# Patient Record
Sex: Female | Born: 1955 | Race: White | Hispanic: No | Marital: Married | Smoking: Never smoker
Health system: Southern US, Community
[De-identification: ages and names within clinical notes are randomized; demographics above are authoritative.]

## PROBLEM LIST (undated history)

## (undated) DIAGNOSIS — G500 Trigeminal neuralgia: Secondary | ICD-10-CM

## (undated) DIAGNOSIS — F419 Anxiety disorder, unspecified: Secondary | ICD-10-CM

## (undated) DIAGNOSIS — T8859XA Other complications of anesthesia, initial encounter: Secondary | ICD-10-CM

## (undated) DIAGNOSIS — Z87442 Personal history of urinary calculi: Secondary | ICD-10-CM

## (undated) DIAGNOSIS — S0300XA Dislocation of jaw, unspecified side, initial encounter: Secondary | ICD-10-CM

## (undated) DIAGNOSIS — K219 Gastro-esophageal reflux disease without esophagitis: Secondary | ICD-10-CM

## (undated) DIAGNOSIS — M1990 Unspecified osteoarthritis, unspecified site: Secondary | ICD-10-CM

## (undated) DIAGNOSIS — E039 Hypothyroidism, unspecified: Secondary | ICD-10-CM

## (undated) HISTORY — PX: CHOLECYSTECTOMY: SHX55

## (undated) HISTORY — PX: OTHER SURGICAL HISTORY: SHX169

## (undated) HISTORY — DX: Dislocation of jaw, unspecified side, initial encounter: S03.00XA

## (undated) HISTORY — DX: Anxiety disorder, unspecified: F41.9

## (undated) HISTORY — DX: Trigeminal neuralgia: G50.0

---

## 1997-11-17 ENCOUNTER — Other Ambulatory Visit: Admission: RE | Admit: 1997-11-17 | Discharge: 1997-11-17 | Payer: Self-pay | Admitting: Obstetrics and Gynecology

## 1998-11-22 ENCOUNTER — Other Ambulatory Visit: Admission: RE | Admit: 1998-11-22 | Discharge: 1998-11-22 | Payer: Self-pay | Admitting: Obstetrics and Gynecology

## 1999-04-26 ENCOUNTER — Encounter (INDEPENDENT_AMBULATORY_CARE_PROVIDER_SITE_OTHER): Payer: Self-pay

## 1999-04-26 ENCOUNTER — Observation Stay (HOSPITAL_COMMUNITY): Admission: RE | Admit: 1999-04-26 | Discharge: 1999-04-27 | Payer: Self-pay | Admitting: *Deleted

## 2000-01-09 ENCOUNTER — Other Ambulatory Visit: Admission: RE | Admit: 2000-01-09 | Discharge: 2000-01-09 | Payer: Self-pay | Admitting: Obstetrics and Gynecology

## 2000-10-10 ENCOUNTER — Ambulatory Visit (HOSPITAL_COMMUNITY): Admission: RE | Admit: 2000-10-10 | Discharge: 2000-10-10 | Payer: Self-pay | Admitting: Obstetrics and Gynecology

## 2000-10-10 ENCOUNTER — Encounter (INDEPENDENT_AMBULATORY_CARE_PROVIDER_SITE_OTHER): Payer: Self-pay

## 2000-10-10 ENCOUNTER — Inpatient Hospital Stay (HOSPITAL_COMMUNITY): Admission: AD | Admit: 2000-10-10 | Discharge: 2000-10-10 | Payer: Self-pay | Admitting: Obstetrics and Gynecology

## 2001-01-29 ENCOUNTER — Other Ambulatory Visit: Admission: RE | Admit: 2001-01-29 | Discharge: 2001-01-29 | Payer: Self-pay | Admitting: Obstetrics and Gynecology

## 2001-02-06 ENCOUNTER — Encounter: Payer: Self-pay | Admitting: Obstetrics and Gynecology

## 2001-02-06 ENCOUNTER — Ambulatory Visit (HOSPITAL_COMMUNITY): Admission: RE | Admit: 2001-02-06 | Discharge: 2001-02-06 | Payer: Self-pay | Admitting: Obstetrics and Gynecology

## 2001-02-10 ENCOUNTER — Encounter: Payer: Self-pay | Admitting: Obstetrics and Gynecology

## 2001-02-10 ENCOUNTER — Encounter: Admission: RE | Admit: 2001-02-10 | Discharge: 2001-02-10 | Payer: Self-pay | Admitting: Obstetrics and Gynecology

## 2001-02-11 ENCOUNTER — Encounter: Payer: Self-pay | Admitting: Urology

## 2001-02-11 ENCOUNTER — Encounter: Admission: RE | Admit: 2001-02-11 | Discharge: 2001-02-11 | Payer: Self-pay | Admitting: Urology

## 2002-02-26 ENCOUNTER — Other Ambulatory Visit: Admission: RE | Admit: 2002-02-26 | Discharge: 2002-02-26 | Payer: Self-pay | Admitting: Obstetrics and Gynecology

## 2002-12-02 ENCOUNTER — Ambulatory Visit (HOSPITAL_COMMUNITY): Admission: RE | Admit: 2002-12-02 | Discharge: 2002-12-02 | Payer: Self-pay | Admitting: Obstetrics and Gynecology

## 2002-12-02 ENCOUNTER — Encounter: Payer: Self-pay | Admitting: Obstetrics and Gynecology

## 2003-05-18 ENCOUNTER — Other Ambulatory Visit: Admission: RE | Admit: 2003-05-18 | Discharge: 2003-05-18 | Payer: Self-pay | Admitting: Obstetrics and Gynecology

## 2003-05-19 ENCOUNTER — Ambulatory Visit (HOSPITAL_COMMUNITY): Admission: RE | Admit: 2003-05-19 | Discharge: 2003-05-19 | Payer: Self-pay | Admitting: Urology

## 2003-11-09 ENCOUNTER — Ambulatory Visit (HOSPITAL_BASED_OUTPATIENT_CLINIC_OR_DEPARTMENT_OTHER): Admission: RE | Admit: 2003-11-09 | Discharge: 2003-11-09 | Payer: Self-pay | Admitting: Urology

## 2004-03-18 HISTORY — PX: BACK SURGERY: SHX140

## 2004-06-08 ENCOUNTER — Ambulatory Visit (HOSPITAL_COMMUNITY): Admission: RE | Admit: 2004-06-08 | Discharge: 2004-06-08 | Payer: Self-pay | Admitting: Obstetrics and Gynecology

## 2004-06-08 ENCOUNTER — Encounter (INDEPENDENT_AMBULATORY_CARE_PROVIDER_SITE_OTHER): Payer: Self-pay | Admitting: *Deleted

## 2004-08-23 ENCOUNTER — Other Ambulatory Visit: Admission: RE | Admit: 2004-08-23 | Discharge: 2004-08-23 | Payer: Self-pay | Admitting: Obstetrics and Gynecology

## 2006-01-01 ENCOUNTER — Emergency Department: Payer: Self-pay | Admitting: Emergency Medicine

## 2006-03-24 ENCOUNTER — Ambulatory Visit: Payer: Self-pay | Admitting: Specialist

## 2006-04-15 ENCOUNTER — Ambulatory Visit (HOSPITAL_COMMUNITY): Admission: RE | Admit: 2006-04-15 | Discharge: 2006-04-16 | Payer: Self-pay | Admitting: Neurosurgery

## 2006-04-15 ENCOUNTER — Encounter (INDEPENDENT_AMBULATORY_CARE_PROVIDER_SITE_OTHER): Payer: Self-pay | Admitting: *Deleted

## 2007-12-03 ENCOUNTER — Encounter: Admission: RE | Admit: 2007-12-03 | Discharge: 2007-12-03 | Payer: Self-pay | Admitting: Obstetrics and Gynecology

## 2009-12-11 ENCOUNTER — Encounter: Admission: RE | Admit: 2009-12-11 | Discharge: 2009-12-11 | Payer: Self-pay | Admitting: Obstetrics and Gynecology

## 2010-04-08 ENCOUNTER — Encounter: Payer: Self-pay | Admitting: Obstetrics and Gynecology

## 2010-04-09 ENCOUNTER — Encounter: Payer: Self-pay | Admitting: Obstetrics and Gynecology

## 2010-08-03 NOTE — Op Note (Signed)
NAME:  Erin Fischer, Erin Fischer                           ACCOUNT NO.:  [id number]   MEDICAL RECORD NO.:  [id number]                   PATIENT TYPE:  AMB   LOCATION:  [facility]                                 FACILITY:  [facility]   PHYSICIAN:  Valetta Fuller, M.D.               DATE OF BIRTH:  October 07, 1955   DATE OF PROCEDURE:  DATE OF DISCHARGE:                                 OPERATIVE REPORT   PREOPERATIVE DIAGNOSES:  Left distal ureteral calculus.   POSTOPERATIVE DIAGNOSES:  Left distal ureteral calculus.   PROCEDURE:  Cystoscopy, retrograde pyelography, ureteroscopy, holmium laser  lithotripsy, basketing of fragments, double J stent placement.   SURGEON:  Valetta Fuller, M.D.   ANESTHESIA:  General.   INDICATIONS FOR PROCEDURE:  [name] is a 55 year old female. She originally  presented several months ago with a proximal left ureteral stone.  She  underwent lithotripsy which partially fragmented the stone. She continued to  have a 6-7 mm stone fragment in her proximal ureter. She remained relatively  asymptomatic. She had an assessment which suggested no significant  hydronephrosis. Because she was relatively asymptomatic, we elected to  continue to observe this.  Eventually the stone migrated distally and has  been more symptomatic at this point. Because this has been ongoing for  several months, we felt that intervention was now indicated.   TECHNIQUE:  The patient was brought to the operating room where she had  successful induction of general anesthesia.  She was placed in lithotomy  position and prepped and draped in the usual manner.  Cystoscopy was  unremarkable. Retrograde pyelogram confirmed the 6 mm filling defect in the  distal left ureter with some mild dilation and hydronephrosis.  A guidewire  was passed beyond the stone. Direct visual ureteroscopy was performed with a  short 6 French ureteroscope. The stone was easily encountered and the distal  ureter was engaged without the  need for dilation. The stone itself appeared  to be somewhat impacted in the distal ureter. A holmium laser lithotriptor  fiber was then used to break up the stone into innumerable pieces. The  largest 3-4 fragments were basket extracted. With the guidewire in good  position, we placed a 6 French 24 cm double J stent with a dangle string  which was attached to the patient's inner thigh. She appeared to tolerate  the procedure well and there were no obvious complications.                                               Valetta Fuller, M.D.    DSG/MEDQ  D:  11/09/2003  T:  11/09/2003  Job:  [id number]

## 2010-08-03 NOTE — Op Note (Signed)
NAMEKATORI, WIRSING                 ACCOUNT NO.:  [id number]   MEDICAL RECORD NO.:  [id number]          PATIENT TYPE:  AMB   LOCATION:  [facility]S                          FACILITY:  [facility]   PHYSICIAN:  Clydene Fake, M.D.  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   PROCEDURE:  Left L4-5 laminectomy for resection of epidural mass,  microdissection with microscope.   SURGEON:  Clydene Fake, M.D.   ASSISTANT:  None.   General endotracheal tube anesthesia.   SPECIMEN:  Epidural mass sent to pathology, questionable synovial cyst.   ESTIMATED BLOOD LOSS:  Minimal.   DRAINS:  None.   COMPLICATIONS:  None.   REASON FOR PROCEDURE:  The patient is a 55 year old woman who has been  having left hip and leg pain despite steroids and anti-inflammatory  medicines.  MRI was done showing a synovial cyst around the left L4-5  facet causing lateral recess stenosis and root compression.  The patient  brought in for decompressive laminectomy resection of the epidural mass,  probable synovial cyst.   PROCEDURE IN DETAIL:  The patient was brought in and after general  anesthesia induced, the patient was placed in the prone position on the  Wilson frame with all pressure points padded.  The patient was prepped  and draped in a sterile fashion.  The site of incision was injected 10  mL of 1% lidocaine with epinephrine.  A needle was placed in the  interspace.  X-ray was obtained showing this was pointed at the L4-5  interspace.  Incision was then made centered where the needle was,  incision taken down to the fascia and hemostasis obtained with Bovie  cauterization.  The fascia was incised on the left side and a  subperiosteal dissection was done over the L4 and L5 spinous process and  lamina out to the  facet joint.  A self-retaining retractor was placed, a  marker was placed in the interspace, and another x-ray was obtained  confirming our positioning at L4-5.  A decompressive semihemilaminectomy  and medial facetectomy was then performed, starting with a high-speed  drill and finishing with the Kerrison punches.  The ligament flavum was  then removed and a foraminotomy done over the L5 root as we continued  our dissection laterally.  There a thin-walled, pale epidural mass was  seen.  It was stuck to the posterior ligament and we carefully went  around this cyst, separating it from the dura very easily and removing  it in total with an en bloc resection of this epidural mass and the cyst  sent down to pathology for evaluation.  We continued the medial  facetectomy to make sure the lateral recess was decompressed.  We  checked the L4 and l5 roots, and we had good decompression of those  roots.  Hemostasis was obtained with Gelfoam and thrombin.  This was  then irrigated out.  WE irrigated with antibiotic solution, had good  hemostasis, and retractors were removed.  The fascia closed with 0  Vicryl interrupted suture, the subcutaneous tissue closed with 0, 2-0  and 3-0 Vicryl interrupted suture, the skin closed with Benzoin and  Steri-Strips.  A dressing was placed.  The patient was placed back in  the supine position, awoken from anesthesia and transferred to the  recovery room in stable condition.           ______________________________  Clydene Fake, M.D.     JRH/MEDQ  D:  04/15/2006  T:  04/15/2006  Job:  [id number]

## 2010-08-03 NOTE — H&P (Signed)
NAMENETTYE, FLEGAL NO.:  [id number]   MEDICAL RECORD NO.:  [id number]          PATIENT TYPE:  AMB   LOCATION:  [facility]                           FACILITY:  [facility]   PHYSICIAN:  Juluis Mire, M.D.   DATE OF BIRTH:  08-23-1955   DATE OF ADMISSION:  DATE OF DISCHARGE:                                HISTORY & PHYSICAL   The patient is a 55 year old gravida 2, para 1, aborta 1, married white  female who presents for hysteroscopy and diagnostic laparoscopic laser  standby.   In relation to the present admission, the patient is postmenopausal.  She  had resumption of vaginal bleeding in November of 2004.  Subsequently, we  performed a saline infusion ultrasound.  There was a possibility of  adenomyosis.  There were several polypoid masses seen within the endometrial  cavity.  That is why she is proceeding with hysteroscopy.  She is also  reporting some persistent pain in the left lower quadrant.  It is of note  that she has had a previous laparoscopic evaluation, however, she wishes to  have [place] look one more time to rule out such causes of adhesions or  endometriosis, so we will proceed with repeat laparoscopy.   In terms of allergies, no known drug allergies.   MEDICATIONS:  1.  Zoloft.  2.  Levoxyl.   PAST MEDICAL HISTORY:  She has a history of goiter and hypothyroidism.  She  is on Levoxyl as noted and followed by Dr. Everardo All.  Otherwise usual  childhood diseases without any significant sequelae.   PAST SURGICAL HISTORY:  1.  She had a D&C.  2.  She had a laparoscopic cholecystectomy.  3.  In 2002, she had a hysteroscopy and diagnostic laparoscopy with laser      standby with negative findings.   OBSTETRIC HISTORY:  One spontaneous vaginal delivery, one spontaneous  abortion.   FAMILY HISTORY:  Noncontributory.   SOCIAL HISTORY:  No tobacco or alcohol use.   REVIEW OF SYSTEMS:  Noncontributory.   PHYSICAL EXAMINATION:  GENERAL:  The patient is afebrile  with stable vital  signs.  HEENT:  The patient is normocephalic.  Pupils are equal, round and reactive  to light and accommodation.  Extraocular movements are intact.  Sclerae and  conjunctivae are clear.  Oropharynx clear.  NECK:  Without thyromegaly.  BREASTS:  No discrete masses.  LUNGS:  Clear.  CARDIOVASCULAR:  Regular rhythm and rate without murmurs or gallops.  ABDOMEN:  Exam is benign.  No mass, organomegaly or tenderness.  PELVIC:  Normal external genitalia.  Vaginal mucosa clear.  Cervix  unremarkable.  Uterus normal size, shape and contour.  Adnexa unremarkable.  Rectovaginal exam is clear.  EXTREMITIES:  Trace edema.  NEUROLOGIC:  Exam is grossly within normal limits.   IMPRESSION:  1.  Postmenopausal bleeding.  2.  Chronic pelvic pain.   PLAN:  The patient will undergo hysteroscopy with resectoscope, as well as  diagnostic laparoscopy laser standby.  The risks of surgery have been  discussed, including anesthetic concerns.  The risk of infection.  The risk  of vascular injury that could lead to hemorrhage requiring transfusion or  possible hysterectomy.  The risk of injury to adjacent organs, such a bowel  or bladder that could require further exploratory surgery. The risk of deep  vein thrombosis, pulmonary embolus and anesthetic concerns.  The patient  understands the potential risks and complications for surgery and  alternatives.      JSM/MEDQ  D:  06/08/2004  T:  06/08/2004  Job:  [id number]

## 2010-08-03 NOTE — H&P (Signed)
[facility]  Patient:    Erin Fischer, Erin Fischer                        MRN: [id number] Adm. Date:  [id number] Attending:  Frederich Balding                         History and Physical  CHIEF COMPLAINT:              The patient is a 55 year old gravida 2 para 1 abortus 1 postmenopausal female, who presents for hysteroscopy, dilatation and curettage, and diagnostic laparotomy with laser standby.  HISTORY OF PRESENT ILLNESS:   The patient has been experiencing increasing bleeding on exogenous hormone replacement therapy.  She has had a previous saline infusion ultrasound that was negative.  However, there was a thickened inhomogeneous endometrium.  In view of this she presents for hysteroscopy with D&C to rule out endometrial pathology.  She is also experiencing significant discomfort during intercourse with deep penetration.  Presumptively, we may be dealing with endometriosis or adhesions.  Will proceed with laparoscopic evaluation.  ALLERGIES:                    No known drug allergies.  MEDICATIONS:                  1. Synthroid.                               2. Cytomel.                               3. Prilosec.                               4. Prempro.  PAST MEDICAL HISTORY:         History of thyroid goiter that has responded to thyroid replacement.  She is followed by a local endocrinologist for this.  Otherwise, usual childhood diseases without any surgery sequelae. PAST SURGICAL HISTORY:        1. D&C.                               2. Laparoscopic cholecystectomy.  OBSTETRICAL HISTORY:          1. One spontaneous vaginal delivery.                               2. One spontaneous abortion.  FAMILY HISTORY:               Noncontributory.  SOCIAL HISTORY:               No tobacco or alcohol use.  REVIEW OF SYSTEMS:            Noncontributory.  PHYSICAL EXAMINATION:  VITAL SIGNS:                  The patient is afebrile with stable vital  signs.  HEENT:                        Normocephalic.  PERRLA.  EOMI.  Sclerae and conjunctivae clear.  Oropharynx clear.  NECK:                         Without thyromegaly.  BREAST:                       No discrete masses.  LUNGS:                        Clear.  CARDIAC:                      Regular rate and rhythm.  No murmurs or gallops.  ABDOMEN:                      Benign.  PELVIC:                       Normal external genitalia.  Vaginal mucosa clear.  Cervix unremarkable.  Uterus normal size and shape and contour. Adnexa free of masses or tenderness.  EXTREMITIES:                  Trace edema.  NEUROLOGIC:                   Grossly within normal limits.  IMPRESSION:                   1. Postmenopausal bleeding on exogenous hormone                                  replacement therapy, rule out endometrial                                  pathology.                               2. Pelvic pain with intercourse, rule out                                  pelvic pathology.                               3. Thyroid goiter.  PLAN:                         The patient is to undergo hysteroscopy with  D&C and diagnostic laparotomy with laser standby.  The risks of surgery have been discussed including the risks of anesthesia, the risk of incisional infection or bruising, the risk of vascular injury that could require transfusion with the risk of AIDS or hepatitis, the risk of injury to adjacent organs including bladder or bowel that could require further exploratory surgery, the risk of deep vein thrombosis and pulmonary embolus.  The patient expressed understanding of the indications and risks. DD:  10/10/00 TD:  10/10/00 Job: [id number]

## 2010-08-03 NOTE — Op Note (Signed)
[facility]  Patient:    Erin Fischer, Erin Fischer                          MRN: [id number] Proc. Date: 10/10/00 Adm. Date:  [id number] Attending:  Cordelia Pen Ii                           Operative Report  PREOPERATIVE DIAGNOSES:       1. Abnormal postmenopausal bleeding.                               2. Pelvic pain.  POSTOPERATIVE DIAGNOSES:      1. Abnormal postmenopausal bleeding.                               2. Pelvic pain.  OPERATIVE PROCEDURES:         1. Hysteroscopy with endometrial biopsies.                               2. Uterine curettage.                               3. Open laparoscopy.  SURGEON:                      Juluis Mire, M.D.  ANESTHESIA:                   General endotracheal.  ESTIMATED BLOOD LOSS:         Minimal.  PACKS/DRAINS:                 None.  INTRAOPERATIVE BLOOD REPLACEMENT:                  None.  COMPLICATIONS:                None.  INDICATIONS:                  Dictated in the history and physical.  DESCRIPTION OF PROCEDURE:     The patient was taken to the OR and placed in the supine position.  After the satisfactory level of general endotracheal anesthesia obtained, the patient was placed in the dorsal lithotomy position using Allen stirrups.  The abdomen, perineum and vagina were prepped out with Betadine.  Examination under anesthesia revealed the uterus to be mid position, normal size and shape.  The patient was then draped out for hysteroscopy.  A speculum was placed in the vaginal vault.  The cervix was grasped with a single-tooth tenaculum.  The uterus was sounded to 10 cm.  The cervix was serially dilated to a size 33-Pratt dilator.  The operative hysteroscope was then introduced and the intrauterine contents were distended using Sorbitol.  Visualization revealed a smooth endometrial cavity.  There was some slight endometrial build up.  Multiple endometrial biopsies were obtained and sent  for pathological review.  There was no evidence of uterine perforation or active bleeding.  Endometrial curettings were then obtained. The Hulka tenaculum was then put in place.  The single-tooth tenaculum and speculum were then removed.  The bladder was then emptied by in and out catheterization.  Next, a subumbilical incision was made with a knife and then extended through the subcutaneous tissue.  The fascia was then identified and entered sharply with incision ______ laterally.  The peritoneum was entered.  There was no evidence of any periumbilical adhesions.  Two lateral sutures of 0 Vicryl were put in place and held.  The Hasson cannula was put in place and secured.  The abdomen was inflated with carbon dioxide.  The laparoscope was then introduced.  There was no evidence of injury to adjacent organs.  A 5 mm trocar was put in place in this suprapubic area under direct visualization. Visualization revealed the normal appendix.  Both lateral gutters were clear. The upper abdomen was unremarkable.  The gallbladder was surgically absent. THe uterus was upper limits of normal size.  The tubes and ovaries were unremarkable.  There were some simple, small, functional cysts of each ovary. But, there is active endometriosis or adhesive process.  The abdomen was then deflated from its carbon dioxide.  All trocars were removed.  The subumbilical fascia was closed with a ______ of 0 Vicryl.  The skin was closed using subcuticular 4-0 Vicryl.  The suprapubic incision was then closed with Steri-Strips.  The Hulka tenaculum was then removed.  The patient was taken out of the dorsal supine position.  The limbs were lowered and she was extubated and then transferred to the recovery room in good condition. Sponge, instrument, needle count were reported as correct by circulating nurse x 2. DD:  10/10/00 TD:  10/11/00 Job: [id number]

## 2010-08-03 NOTE — Op Note (Signed)
NAMELARUA, COLLIER                 ACCOUNT NO.:  [id number]   MEDICAL RECORD NO.:  [id number]          PATIENT TYPE:  AMB   LOCATION:  [facility]C                           FACILITY:  [facility]   PHYSICIAN:  Juluis Mire, M.D.   DATE OF BIRTH:  [birth year]   DATE OF PROCEDURE:  06/08/2004  DATE OF DISCHARGE:                                 OPERATIVE REPORT   PREOPERATIVE DIAGNOSES:  1.  Pelvic pain.  2.  Postmenopausal bleeding with probable endometrial polyp.   POSTOPERATIVE DIAGNOSES:  1.  Possible uterus subseptus.  2.  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  3.  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   OPERATIVE PROCEDURES:  1.  Cervical dilatation and hysteroscopy with multiple endometrial biopsies      and endometrial curettings.  2.  Open laparoscopy.  3.  Cautery of endometriotic implants.  4.  Excision of small bowel lesion.   SURGEON:  Juluis Mire, M.D.   ANESTHESIA:  General.   ESTIMATED BLOOD LOSS:  Minimal.   PACKS AND DRAINS:  None.   INTRAOPERATIVE BLOOD REPLACED:  None.   INDICATIONS:  Dictated in the history and physical.   The procedure was as follows:  The patient was taken to the OR and placed in  supine position.  After a satisfactory level of general anesthesia was  obtained, the patient was placed in the dorsal lithotomy position using the  Allen stirrups.  The abdomen, perineum and vagina were prepped out with  Betadine.  The patient then draped out for hysteroscopy.  A speculum was  placed in the vaginal vault.  The cervix was grasped with a single-tooth  tenaculum.  The uterus sounded to 8 cm.  The cervix was serially dilated to  a size 35 Pratt dilator.  Operative hysteroscope was introduced.  The  intrauterine cavity was distended using sorbitol.  The endometrium was  smooth and atrophic.  She appeared to have a small septum at the uterine  fundus, but there was no evidence of polyps.  We did multiple endometrial  biopsies from the anterior, posterior and  lateral walls.  We also obtained  endometrial curettings.  There was no signs of active bleeding or  perforation.  At this point in time a Hulka tenaculum was put in place, the  single-tooth tenaculum and speculum were then removed.  She did have a small  rent in the cervix from the single-tooth tenaculum.  This was brought  together with a figure-of-eight of 2-0 chromic.   At this point in time we went to the laparoscopy.  The legs were  repositioned.  The Foley was already put into place and was to straight  drain.  The previous subumbilical incision was excised.  The incision was  extended through the subcutaneous tissue.  The fascia was identified and  entered sharply and the incision in the fascia extended laterally,  peritoneum was entered.  The [facility] open laparoscopic trocar was put in  place and secured.  The laparoscope was introduced.  There was no evidence  of injury to adjacent organs.  A 5 mm  trocar was put in place under direct  visualization.  Visualization revealed the uterus to be small.  Both ovaries  were somewhat atrophic.  Tubes were unremarkable.  She had several apparent  implants of endometriosis in the cul-de-sac along the left pelvic sidewall.  These were whitish in color.  These were cauterized.  On the small bowel on  the right side, she had a blackish lesion attached to the mesentery.  This  was elevated with pick-ups, and we used the scissors to excise it.  We used  cautery for hemostasis.  There was no entry to the small bowel.  This was  sent for pathologic review.  At this point in time we had no active bleeding  or signs or injury to adjacent organs and no other active processes.  We  visualized the appendix was normal.  The colon, including the descending and  sigmoid colon, were normal.  The upper abdomen, including the liver, was  clear.  The gallbladder was surgically absent.  At this point in time the  abdomen was deflated of its carbon dioxide, all  trocars removed.  The  subumbilical fascia was closed with two figure-of-eights of 0 Vicryl.  The  skin was closed with interrupted subcuticulars of 4-0 Vicryl.  The  suprapubic incision was closed with Dermabond.  The Hulka tenaculum was then  removed and the patient taken out of the dorsal lithotomy position and once  alert and extubated, transferred to the recovery room in good condition.  Sponge, instrument and needle count reported as correct by the circulating  nurse x2.      JSM/MEDQ  D:  06/08/2004  T:  06/09/2004  Job:  [id number]

## 2010-12-11 ENCOUNTER — Other Ambulatory Visit: Payer: Self-pay | Admitting: Obstetrics and Gynecology

## 2010-12-11 DIAGNOSIS — Z1231 Encounter for screening mammogram for malignant neoplasm of breast: Secondary | ICD-10-CM

## 2010-12-18 ENCOUNTER — Ambulatory Visit
Admission: RE | Admit: 2010-12-18 | Discharge: 2010-12-18 | Disposition: A | Discharge status: Home / Self Care | Payer: Private Health Insurance - Indemnity | Source: Ambulatory Visit | Attending: Obstetrics and Gynecology | Admitting: Obstetrics and Gynecology

## 2010-12-18 DIAGNOSIS — Z1231 Encounter for screening mammogram for malignant neoplasm of breast: Secondary | ICD-10-CM

## 2011-12-13 ENCOUNTER — Other Ambulatory Visit: Payer: Self-pay | Admitting: Obstetrics and Gynecology

## 2011-12-13 DIAGNOSIS — Z1231 Encounter for screening mammogram for malignant neoplasm of breast: Secondary | ICD-10-CM

## 2012-01-06 ENCOUNTER — Ambulatory Visit
Admission: RE | Admit: 2012-01-06 | Discharge: 2012-01-06 | Disposition: A | Discharge status: Home / Self Care | Payer: Private Health Insurance - Indemnity | Source: Ambulatory Visit | Attending: Obstetrics and Gynecology | Admitting: Obstetrics and Gynecology

## 2012-01-06 DIAGNOSIS — Z1231 Encounter for screening mammogram for malignant neoplasm of breast: Secondary | ICD-10-CM

## 2012-01-09 ENCOUNTER — Other Ambulatory Visit: Payer: Self-pay | Admitting: Obstetrics and Gynecology

## 2012-01-09 DIAGNOSIS — R928 Other abnormal and inconclusive findings on diagnostic imaging of breast: Secondary | ICD-10-CM

## 2012-01-14 ENCOUNTER — Ambulatory Visit
Admission: RE | Admit: 2012-01-14 | Discharge: 2012-01-14 | Disposition: A | Discharge status: Home / Self Care | Payer: Private Health Insurance - Indemnity | Source: Ambulatory Visit | Attending: Obstetrics and Gynecology | Admitting: Obstetrics and Gynecology

## 2012-01-14 ENCOUNTER — Other Ambulatory Visit: Payer: Self-pay | Admitting: Obstetrics and Gynecology

## 2012-01-14 DIAGNOSIS — R928 Other abnormal and inconclusive findings on diagnostic imaging of breast: Secondary | ICD-10-CM

## 2012-01-20 ENCOUNTER — Ambulatory Visit
Admission: RE | Admit: 2012-01-20 | Discharge: 2012-01-20 | Disposition: A | Discharge status: Home / Self Care | Payer: Private Health Insurance - Indemnity | Source: Ambulatory Visit | Attending: Obstetrics and Gynecology | Admitting: Obstetrics and Gynecology

## 2012-01-20 ENCOUNTER — Other Ambulatory Visit: Payer: Self-pay | Admitting: Obstetrics and Gynecology

## 2012-01-20 DIAGNOSIS — N63 Unspecified lump in unspecified breast: Secondary | ICD-10-CM

## 2012-01-20 DIAGNOSIS — R928 Other abnormal and inconclusive findings on diagnostic imaging of breast: Secondary | ICD-10-CM

## 2012-01-21 ENCOUNTER — Other Ambulatory Visit: Payer: Private Health Insurance - Indemnity

## 2012-01-23 ENCOUNTER — Other Ambulatory Visit: Payer: Self-pay | Admitting: Obstetrics and Gynecology

## 2012-01-23 DIAGNOSIS — R928 Other abnormal and inconclusive findings on diagnostic imaging of breast: Secondary | ICD-10-CM

## 2012-01-23 DIAGNOSIS — Z803 Family history of malignant neoplasm of breast: Secondary | ICD-10-CM

## 2012-01-29 ENCOUNTER — Ambulatory Visit
Admission: RE | Admit: 2012-01-29 | Discharge: 2012-01-29 | Disposition: A | Discharge status: Home / Self Care | Payer: PRIVATE HEALTH INSURANCE | Source: Ambulatory Visit | Attending: Obstetrics and Gynecology | Admitting: Obstetrics and Gynecology

## 2012-01-29 DIAGNOSIS — Z803 Family history of malignant neoplasm of breast: Secondary | ICD-10-CM

## 2012-01-29 DIAGNOSIS — R928 Other abnormal and inconclusive findings on diagnostic imaging of breast: Secondary | ICD-10-CM

## 2012-01-29 MED ORDER — GADOBENATE DIMEGLUMINE 529 MG/ML IV SOLN
15.0000 mL | Freq: Once | INTRAVENOUS | Status: AC | PRN
  Administered 2012-01-29: 15 mL via INTRAVENOUS

## 2012-12-08 ENCOUNTER — Other Ambulatory Visit: Payer: Self-pay

## 2012-12-08 DIAGNOSIS — Z1231 Encounter for screening mammogram for malignant neoplasm of breast: Secondary | ICD-10-CM

## 2012-12-28 ENCOUNTER — Other Ambulatory Visit: Payer: Self-pay | Admitting: Obstetrics and Gynecology

## 2012-12-28 DIAGNOSIS — Z803 Family history of malignant neoplasm of breast: Secondary | ICD-10-CM

## 2013-01-06 ENCOUNTER — Ambulatory Visit
Admission: RE | Admit: 2013-01-06 | Discharge: 2013-01-06 | Disposition: A | Discharge status: Home / Self Care | Payer: Private Health Insurance - Indemnity | Source: Ambulatory Visit

## 2013-01-06 DIAGNOSIS — Z1231 Encounter for screening mammogram for malignant neoplasm of breast: Secondary | ICD-10-CM

## 2013-01-08 ENCOUNTER — Ambulatory Visit
Admission: RE | Admit: 2013-01-08 | Discharge: 2013-01-08 | Disposition: A | Discharge status: Home / Self Care | Payer: Medicare HMO | Source: Ambulatory Visit | Attending: Obstetrics and Gynecology | Admitting: Obstetrics and Gynecology

## 2013-01-08 DIAGNOSIS — Z803 Family history of malignant neoplasm of breast: Secondary | ICD-10-CM

## 2013-01-08 MED ORDER — GADOBENATE DIMEGLUMINE 529 MG/ML IV SOLN
16.0000 mL | Freq: Once | INTRAVENOUS | Status: AC | PRN
  Administered 2013-01-08: 16 mL via INTRAVENOUS

## 2013-09-22 ENCOUNTER — Other Ambulatory Visit: Payer: Self-pay | Admitting: Gastroenterology

## 2013-09-22 DIAGNOSIS — K219 Gastro-esophageal reflux disease without esophagitis: Principal | ICD-10-CM

## 2013-09-22 DIAGNOSIS — IMO0001 Reserved for inherently not codable concepts without codable children: Secondary | ICD-10-CM

## 2013-09-24 ENCOUNTER — Ambulatory Visit
Admission: RE | Admit: 2013-09-24 | Discharge: 2013-09-24 | Disposition: A | Discharge status: Home / Self Care | Payer: BC Managed Care – PPO | Source: Ambulatory Visit | Attending: Gastroenterology | Admitting: Gastroenterology

## 2013-09-24 ENCOUNTER — Encounter (INDEPENDENT_AMBULATORY_CARE_PROVIDER_SITE_OTHER): Payer: Self-pay

## 2013-09-24 DIAGNOSIS — IMO0001 Reserved for inherently not codable concepts without codable children: Secondary | ICD-10-CM

## 2013-09-24 DIAGNOSIS — K219 Gastro-esophageal reflux disease without esophagitis: Principal | ICD-10-CM

## 2013-12-07 ENCOUNTER — Other Ambulatory Visit: Payer: Self-pay

## 2013-12-07 DIAGNOSIS — Z1231 Encounter for screening mammogram for malignant neoplasm of breast: Secondary | ICD-10-CM

## 2013-12-28 ENCOUNTER — Other Ambulatory Visit: Payer: Self-pay | Admitting: Obstetrics and Gynecology

## 2013-12-29 LAB — CYTOLOGY - PAP

## 2014-01-10 ENCOUNTER — Ambulatory Visit
Admission: RE | Admit: 2014-01-10 | Discharge: 2014-01-10 | Disposition: A | Discharge status: Home / Self Care | Payer: BC Managed Care – PPO | Source: Ambulatory Visit

## 2014-01-10 ENCOUNTER — Encounter (INDEPENDENT_AMBULATORY_CARE_PROVIDER_SITE_OTHER): Payer: Self-pay

## 2014-01-10 DIAGNOSIS — Z1231 Encounter for screening mammogram for malignant neoplasm of breast: Secondary | ICD-10-CM

## 2014-03-10 ENCOUNTER — Emergency Department: Payer: Self-pay | Admitting: Internal Medicine

## 2014-03-10 LAB — COMPREHENSIVE METABOLIC PANEL
ALK PHOS: 69 U/L
ANION GAP: 9 (ref 7–16)
Albumin: 3.7 g/dL (ref 3.4–5.0)
BILIRUBIN TOTAL: 0.6 mg/dL (ref 0.2–1.0)
BUN: 6 mg/dL — AB (ref 7–18)
CALCIUM: 8.8 mg/dL (ref 8.5–10.1)
CO2: 25 mmol/L (ref 21–32)
CREATININE: 0.86 mg/dL (ref 0.60–1.30)
Chloride: 105 mmol/L (ref 98–107)
EGFR (Non-African Amer.): >60
Glucose: 102 mg/dL — ABNORMAL HIGH (ref 65–99)
OSMOLALITY: 275 (ref 275–301)
Potassium: 3.8 mmol/L (ref 3.5–5.1)
SGOT(AST): 34 U/L (ref 15–37)
SGPT (ALT): 24 U/L
Sodium: 139 mmol/L (ref 136–145)
Total Protein: 7.5 g/dL (ref 6.4–8.2)

## 2014-03-10 LAB — TROPONIN I: Troponin-I: <0.02 ng/mL

## 2014-03-10 LAB — URINALYSIS, COMPLETE
BILIRUBIN, UR: NEGATIVE
Bacteria: NONE SEEN
Blood: NEGATIVE
Glucose,UR: NEGATIVE mg/dL (ref 0–75)
Ketone: NEGATIVE
LEUKOCYTE ESTERASE: NEGATIVE
NITRITE: NEGATIVE
PH: 6 (ref 4.5–8.0)
Protein: NEGATIVE
RBC,UR: <1 /HPF (ref 0–5)
Specific Gravity: 1.005 (ref 1.003–1.030)
Squamous Epithelial: 1
WBC UR: 1 /HPF (ref 0–5)

## 2014-03-10 LAB — CBC
HCT: 44.2 % (ref 35.0–47.0)
HGB: 14.7 g/dL (ref 12.0–16.0)
MCH: 28.1 pg (ref 26.0–34.0)
MCHC: 33.1 g/dL (ref 32.0–36.0)
MCV: 85 fL (ref 80–100)
Platelet: 168 10*3/uL (ref 150–440)
RBC: 5.22 10*6/uL — ABNORMAL HIGH (ref 3.80–5.20)
RDW: 13.4 % (ref 11.5–14.5)
WBC: 7.2 10*3/uL (ref 3.6–11.0)

## 2014-03-10 LAB — TSH: THYROID STIMULATING HORM: 1.17 u[IU]/mL

## 2014-03-14 ENCOUNTER — Ambulatory Visit: Payer: Self-pay | Admitting: Otolaryngology

## 2014-12-26 ENCOUNTER — Other Ambulatory Visit: Payer: Self-pay

## 2014-12-26 DIAGNOSIS — Z1231 Encounter for screening mammogram for malignant neoplasm of breast: Secondary | ICD-10-CM

## 2015-01-23 ENCOUNTER — Ambulatory Visit
Admission: RE | Admit: 2015-01-23 | Discharge: 2015-01-23 | Disposition: A | Discharge status: Home / Self Care | Payer: 59 | Source: Ambulatory Visit

## 2015-01-23 DIAGNOSIS — Z1231 Encounter for screening mammogram for malignant neoplasm of breast: Secondary | ICD-10-CM

## 2015-05-05 ENCOUNTER — Ambulatory Visit (HOSPITAL_COMMUNITY)
Admission: RE | Admit: 2015-05-05 | Discharge: 2015-05-05 | Disposition: A | Discharge status: Home / Self Care | Payer: 59 | Source: Ambulatory Visit | Attending: Cardiology | Admitting: Cardiology

## 2015-05-05 ENCOUNTER — Other Ambulatory Visit (HOSPITAL_COMMUNITY): Payer: Self-pay | Admitting: Specialist

## 2015-05-05 DIAGNOSIS — M79605 Pain in left leg: Secondary | ICD-10-CM | POA: Insufficient documentation

## 2015-05-05 DIAGNOSIS — M79662 Pain in left lower leg: Secondary | ICD-10-CM | POA: Diagnosis not present

## 2015-05-05 DIAGNOSIS — M7989 Other specified soft tissue disorders: Secondary | ICD-10-CM | POA: Insufficient documentation

## 2015-12-17 IMAGING — CT CT NECK WITH CONTRAST
4 of 5 series · 15 of 33 positions shown, 17 images · IV contrast (omnipaque)
Comparison: None.

CLINICAL DATA: Swelling of the right posterior neck, 1 week
duration. Pain in that region.

EXAM:
CT NECK WITH CONTRAST
TECHNIQUE: Multidetector CT imaging of the neck was performed using the
standard protocol following the bolus administration of intravenous
contrast.
CONTRAST:  75 cc Omnipaque 300

[Series 2: axial · axial · 0.49mm/px · z∈[-242,-104]mm · 4 of 115 slices shown, 5 images]
[im 23/115  soft-tissue]
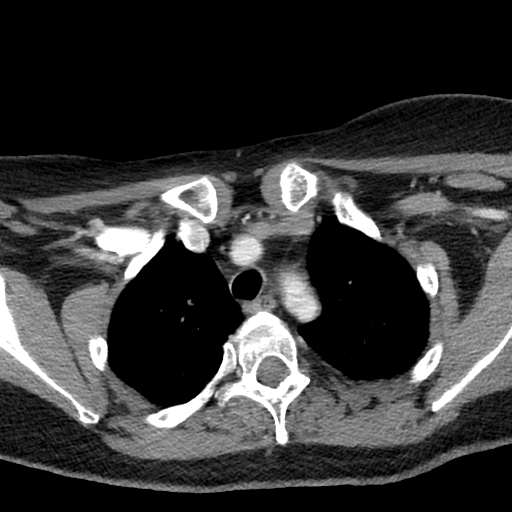
[im 23/115  bone]
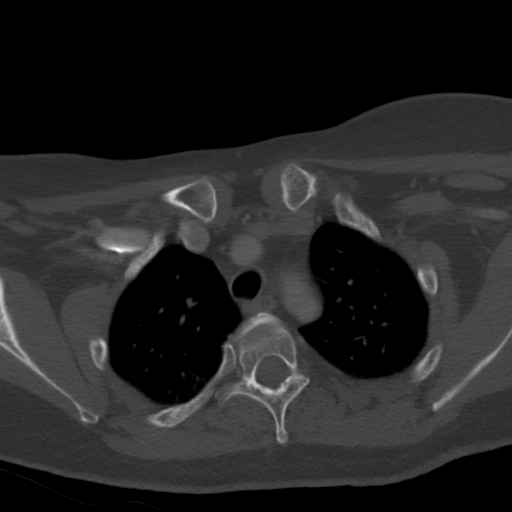
[im 46/115  bone]
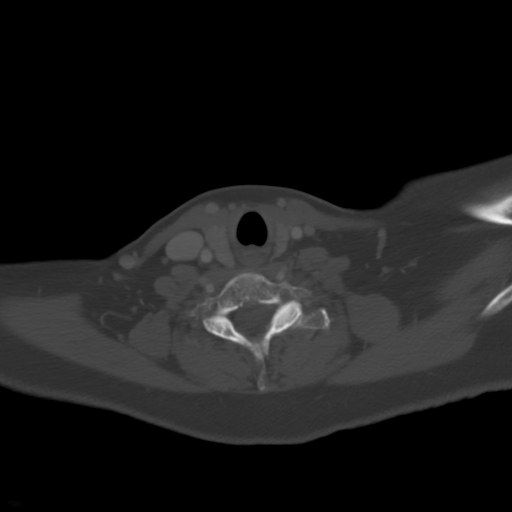
[im 69/115  bone]
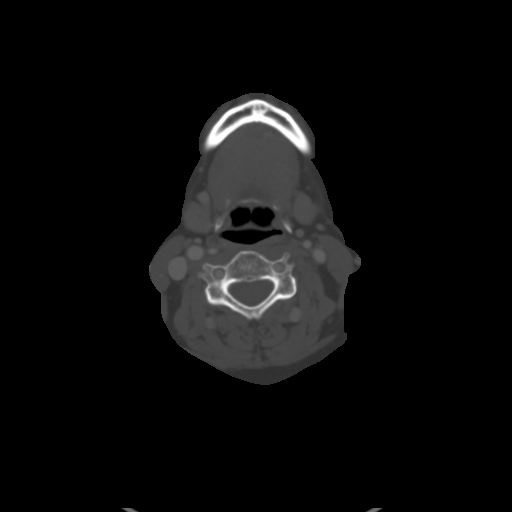
[im 92/115  bone]
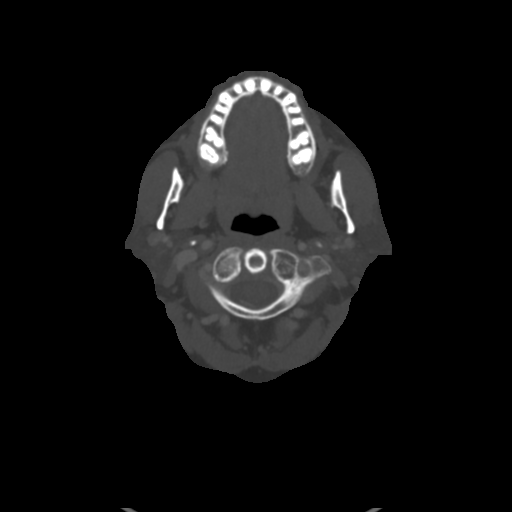

[Series 4: sag neck · sagittal · 0.39mm/px · 5 of 126 slices shown, 6 images]
[im 42/126  bone]
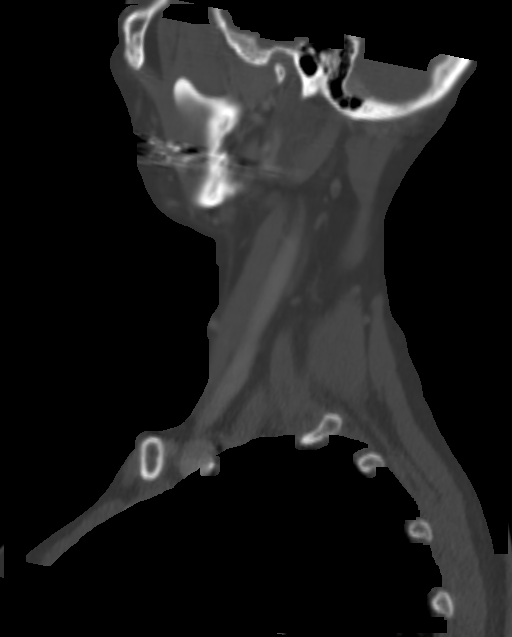
[im 53/126  bone]
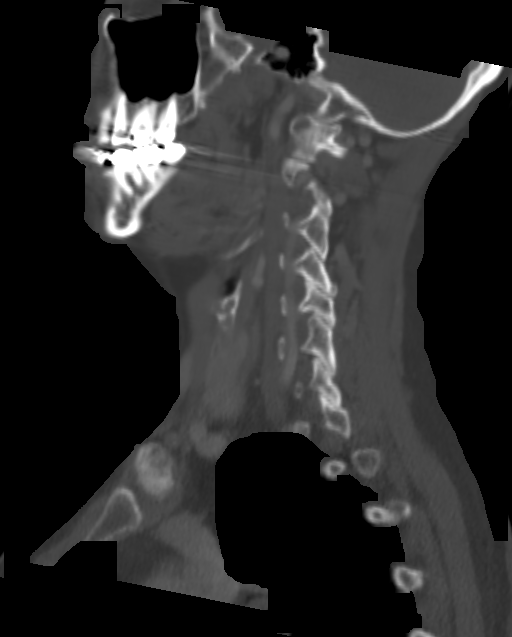
[im 63/126  soft-tissue]
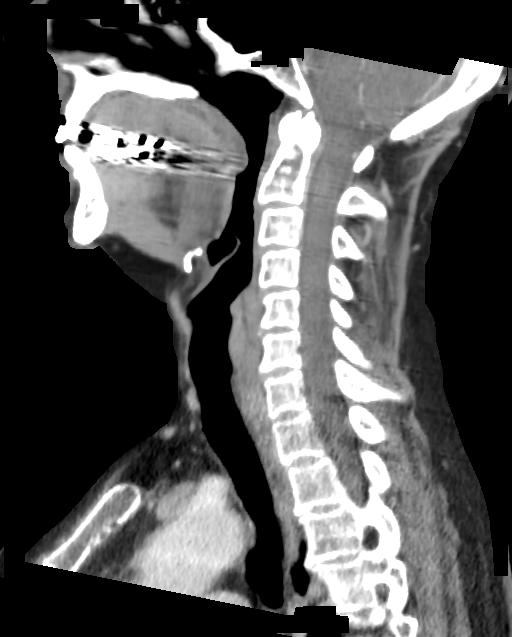
[im 63/126  bone]
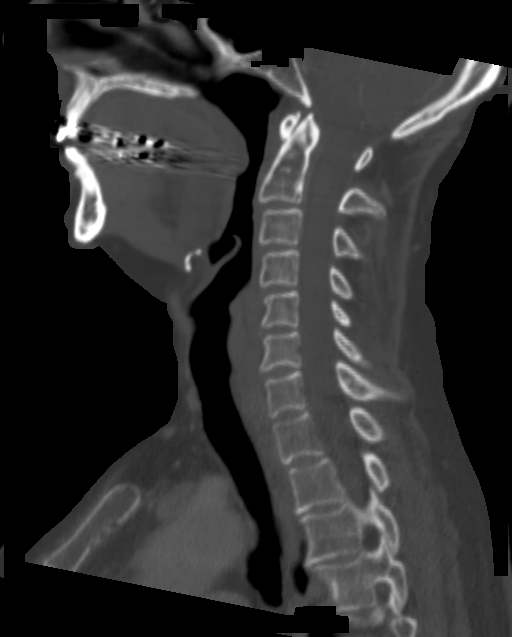
[im 73/126  bone]
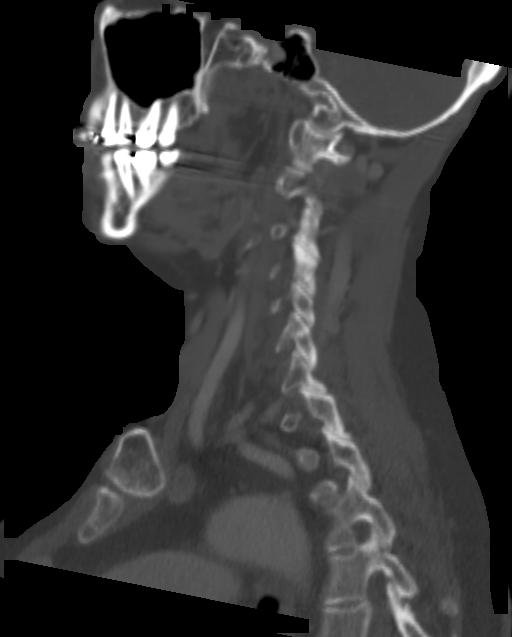
[im 84/126  bone]
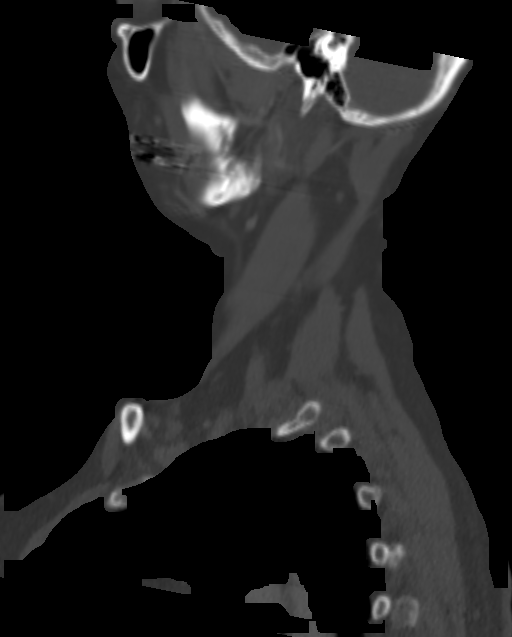

[Series 5: cor neck · coronal · 0.47mm/px · 3 of 101 slices shown]
[im 21/101  bone]
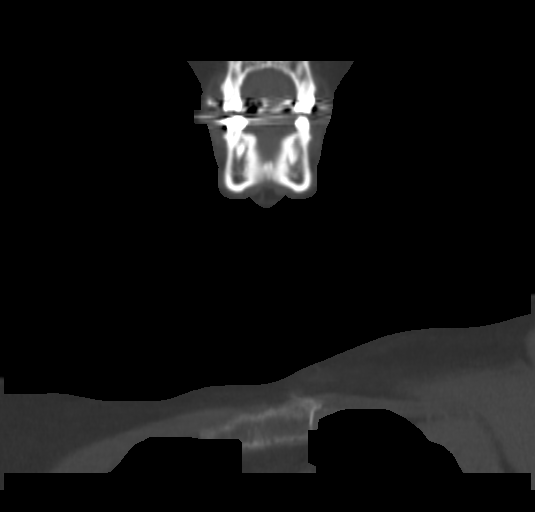
[im 41/101  bone]
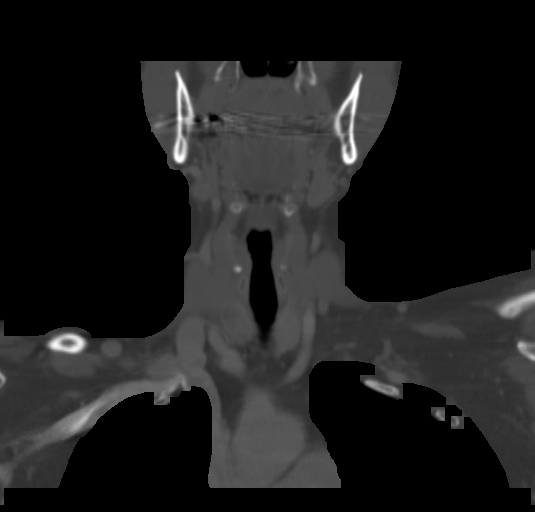
[im 61/101  bone]
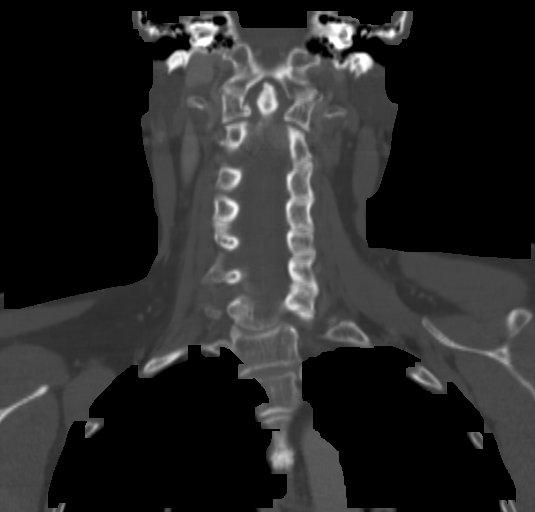

[Series 6: ax oropharynx · axial · 0.39mm/px · z∈[-262,-168]mm · 3 of 122 slices shown]
[im 25/122  bone]
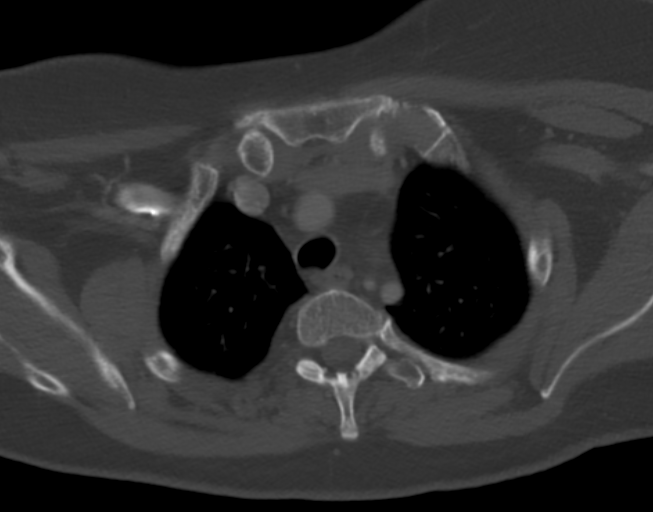
[im 49/122  bone]
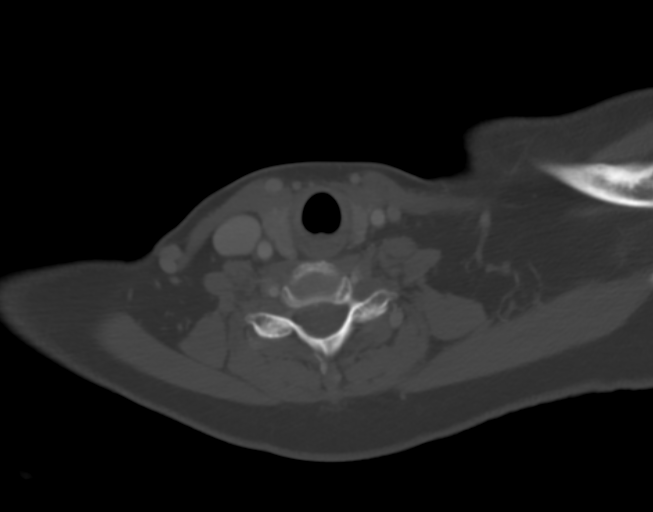
[im 73/122  bone]
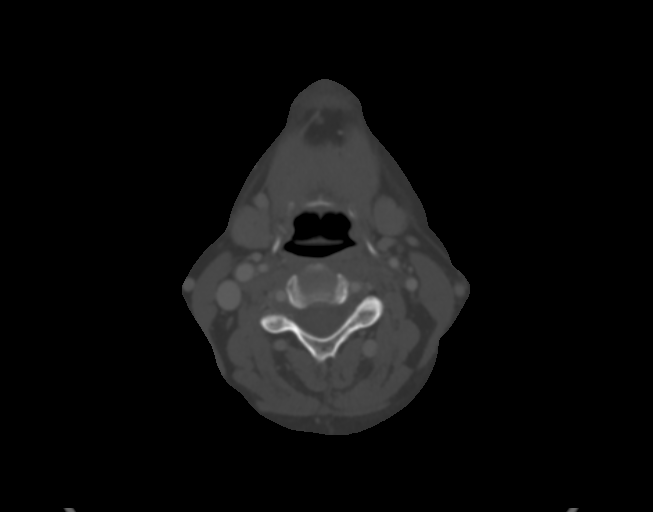

[15 of 33 positions shown; findings below may reference images not displayed]

FINDINGS: Pharynx and larynx: No mucosal or submucosal lesion visible.

Salivary glands: Both parotid glands are normal. Both submandibular
glands are normal.

Thyroid: Symmetric and normal.

Lymph nodes: Marker in the area of concern a overlies the right
mastoid tip. There are 2 or 3 small lymph nodes in this location,
the largest measuring 5 x 6 mm. No superior tube nodes. These may be
palpable as they are very superficial and adjacent to the bony
mastoid. There are presumably mildly inflamed, possibly due 2
folliculitis. There are no pathologically enlarged nodes anywhere
within the neck. No low-density nodes.

Vascular: Normal appearance of the arterial and venous structures.

Limited intracranial: Normal as visualized.

Mastoids and visualized paranasal sinuses: Normal as visualized.

Skeleton: No cervical spine disease.

Upper chest: No pulmonary or superior mediastinal lesion.
IMPRESSION: The area of concern overlies the mastoid tip. There are 2 or 3 small
lymph nodes in that area, possibly mildly inflamed due to
folliculitis (most commonly). The largest node measures only 5 x 6
mm and does not show a low density. There is no finding worrisome
for mass or significant lymphadenopathy.

## 2016-01-09 ENCOUNTER — Other Ambulatory Visit: Payer: Self-pay | Admitting: Obstetrics and Gynecology

## 2016-01-09 DIAGNOSIS — Z1231 Encounter for screening mammogram for malignant neoplasm of breast: Secondary | ICD-10-CM

## 2016-02-01 ENCOUNTER — Ambulatory Visit
Admission: RE | Admit: 2016-02-01 | Discharge: 2016-02-01 | Disposition: A | Discharge status: Home / Self Care | Payer: 59 | Source: Ambulatory Visit | Attending: Obstetrics and Gynecology | Admitting: Obstetrics and Gynecology

## 2016-02-01 DIAGNOSIS — Z1231 Encounter for screening mammogram for malignant neoplasm of breast: Secondary | ICD-10-CM

## 2017-01-01 ENCOUNTER — Other Ambulatory Visit: Payer: Self-pay | Admitting: Obstetrics and Gynecology

## 2017-01-01 DIAGNOSIS — Z1231 Encounter for screening mammogram for malignant neoplasm of breast: Secondary | ICD-10-CM

## 2017-02-03 ENCOUNTER — Ambulatory Visit
Admission: RE | Admit: 2017-02-03 | Discharge: 2017-02-03 | Disposition: A | Discharge status: Home / Self Care | Payer: 59 | Source: Ambulatory Visit | Attending: Obstetrics and Gynecology | Admitting: Obstetrics and Gynecology

## 2017-02-03 DIAGNOSIS — Z1231 Encounter for screening mammogram for malignant neoplasm of breast: Secondary | ICD-10-CM

## 2018-02-10 ENCOUNTER — Other Ambulatory Visit: Payer: Self-pay | Admitting: Obstetrics and Gynecology

## 2018-02-10 DIAGNOSIS — Z1231 Encounter for screening mammogram for malignant neoplasm of breast: Secondary | ICD-10-CM

## 2018-03-23 ENCOUNTER — Ambulatory Visit
Admission: RE | Admit: 2018-03-23 | Discharge: 2018-03-23 | Disposition: A | Discharge status: Home / Self Care | Payer: 59 | Source: Ambulatory Visit | Attending: Obstetrics and Gynecology | Admitting: Obstetrics and Gynecology

## 2018-03-23 DIAGNOSIS — Z1231 Encounter for screening mammogram for malignant neoplasm of breast: Secondary | ICD-10-CM

## 2018-07-28 ENCOUNTER — Telehealth: Payer: Self-pay | Admitting: *Deleted

## 2018-07-28 ENCOUNTER — Other Ambulatory Visit (HOSPITAL_COMMUNITY): Payer: Self-pay | Admitting: Otolaryngology

## 2018-07-28 ENCOUNTER — Other Ambulatory Visit: Payer: Self-pay | Admitting: Otolaryngology

## 2018-07-28 DIAGNOSIS — U071 COVID-19: Secondary | ICD-10-CM

## 2018-07-28 DIAGNOSIS — R29810 Facial weakness: Secondary | ICD-10-CM

## 2018-07-28 DIAGNOSIS — Z20822 Contact with and (suspected) exposure to covid-19: Secondary | ICD-10-CM

## 2018-07-28 NOTE — Telephone Encounter (Signed)
Orders placed for Covid-testing per Dr. Gery Pray request. Pt aware appt 07/29/2018 at 1015. 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

## 2018-07-29 ENCOUNTER — Other Ambulatory Visit: Payer: 59

## 2018-07-29 LAB — NOVEL CORONAVIRUS, NAA: SARS-CoV-2, NAA: NOT DETECTED

## 2018-08-03 LAB — NOVEL CORONAVIRUS, NAA: SARS-CoV-2, NAA: NOT DETECTED

## 2018-08-04 ENCOUNTER — Ambulatory Visit: Payer: 59

## 2018-10-12 ENCOUNTER — Other Ambulatory Visit: Payer: Self-pay

## 2018-10-12 ENCOUNTER — Ambulatory Visit: Payer: 59 | Admitting: Neurology

## 2018-10-12 ENCOUNTER — Encounter: Payer: Self-pay | Admitting: Neurology

## 2018-10-12 VITALS — BP 127/89 | HR 73 | Temp 98.4°F | Ht 65.0 in | Wt 169.2 lb

## 2018-10-12 DIAGNOSIS — R202 Paresthesia of skin: Secondary | ICD-10-CM | POA: Diagnosis not present

## 2018-10-12 DIAGNOSIS — Z5181 Encounter for therapeutic drug level monitoring: Secondary | ICD-10-CM | POA: Diagnosis not present

## 2018-10-12 NOTE — Progress Notes (Signed)
Reason for visit: Left ear pain, facial numbness  Referring physician: Dr. Steva Colder is a 63 y.o. female  History of present illness:  [name] is a 63 year old left-handed white female with a history of anxiety issues.  The patient has known TMJ issues, she has a mouthguard that she uses for this, but she had not been using this on a regular basis until just recently.  Beginning in May 2020, she began having sharp shooting pain in the left ear or just behind the ear.  The patient was seen for an ENT evaluation.  The patient also began to have intermittent numbness or tingling sensations on the left temporal area sometimes going into the left cheek and on the upper lip on the left.  The patient reported no slurred speech or difficulty swallowing or any numbness elsewhere in the body.  When the episodes of numbness would come on, it may last up to an hour and then go away.  She may have episodes off and on throughout the day but the episodes are not always occurring every day.  The patient has noted that the episodes of numbness oftentimes occur when she is anxious about something, taking alprazolam seems to eliminate the numbness.  The patient began using her mouthguard again after consultation with her dentist, and her left ear pain has gone away.  The patient reports no neck pain or neck stiffness.  She reports no weakness of the extremities or difficulty with balance or difficulty controlling the bowels or the bladder.  She has not had any visual changes or dizziness.  She was set up for an MRI of the brain but never did this as she is very claustrophobic.  Past Medical History:  Diagnosis Date  . Anxiety   . TMJ (dislocation of temporomandibular joint)   . Trigeminal neuralgia of left side of face     Past Surgical History:  Procedure Laterality Date  . BACK SURGERY  2006  . CHOLECYSTECTOMY      Family History  Problem Relation Age of Onset  . Breast cancer Sister 73   . Other Mother        Renal Failure   . Lung cancer Father   . Throat cancer Brother   . Atrial fibrillation Brother     Social history:  reports that she has never smoked. She has never used smokeless tobacco. She reports previous alcohol use. She reports that she does not use drugs.  Medications:  Prior to Admission medications   Medication Sig Start Date End Date Taking? Authorizing Provider  Acetaminophen (TYLENOL PO) Take by mouth.   Yes [provider]  ALPRAZolam (XANAX) 0.25 MG tablet Take 0.25 mg by mouth daily. 09/17/18  Yes [provider]  Ibuprofen (ADVIL PO) Take by mouth.   Yes [provider]  levothyroxine (SYNTHROID) 75 MCG tablet Synthroid 75 mcg tablet   Yes [provider]     No Known Allergies  ROS:  Out of a complete 14 system review of symptoms, the patient complains only of the following symptoms, and all other reviewed systems are negative.  Left facial numbness, intermittent Anxiety  Blood pressure 127/89, pulse 73, temperature 98.4 F (36.9 C), temperature source Temporal, height 5\' 5"  (1.651 m), weight 169 lb 4 oz (76.8 kg).  Physical Exam  General: The patient is alert and cooperative at the time of the examination.  The patient is minimally obese.  Eyes: Pupils are equal,  round, and reactive to light. Discs are flat bilaterally.  Neck: The neck is supple, no carotid bruits are noted.  Respiratory: The respiratory examination is clear.  Cardiovascular: The cardiovascular examination reveals a regular rate and rhythm, no obvious murmurs or rubs are noted.  Neuromuscular: No crepitus was noted in the temporomandibular joints.  Skin: Extremities are without significant edema.  Neurologic Exam  Mental status: The patient is alert and oriented x 3 at the time of the examination. The patient has apparent normal recent and remote memory, with an apparently normal attention span and concentration ability.   Cranial nerves: Facial symmetry is present. There is good sensation of the face to pinprick and soft touch bilaterally. The strength of the facial muscles and the muscles to head turning and shoulder shrug are normal bilaterally. Speech is well enunciated, no aphasia or dysarthria is noted. Extraocular movements are full. Visual fields are full. The tongue is midline, and the patient has symmetric elevation of the soft palate. No obvious hearing deficits are noted.  Motor: The motor testing reveals 5 over 5 strength of all 4 extremities. Good symmetric motor tone is noted throughout.  Sensory: Sensory testing is intact to pinprick, soft touch, vibration sensation, and position sense on all 4 extremities. No evidence of extinction is noted.  Coordination: Cerebellar testing reveals good finger-nose-finger and heel-to-shin bilaterally.  Gait and station: Gait is normal. Tandem gait is minimally unsteady. Romberg is negative. No drift is seen.  Reflexes: Deep tendon reflexes are symmetric and normal bilaterally. Toes are downgoing bilaterally.   Assessment/Plan:  1.  Intermittent left facial numbness  2.  Left ear pain, resolved  The patient appears to have a history consistent with a benign sensory alteration, she clearly notes that the left facial sensory alterations occurred during times of anxiety, alprazolam will improve the sensation.  In order to fully evaluate this issue however, MRI of the brain will be required, CT of the head will not be adequate.  The patient is willing to try MRI of the brain with sedation.  She will use Xanax before the scan, we will check blood work today to assess renal function before contrast is given.  If the above studies unremarkable, the patient will follow-up here on an as-needed basis.  Erin Fischer. Keith Jamiaya Bina MD 10/12/2018 8:27 AM  [facility] [address] [place], [place] [id number]  Phone [phone] Fax [phone]

## 2018-10-12 NOTE — Patient Instructions (Signed)
Take 4 tablets approximately 45 minutes prior to the MRI study, take 2 more tablets if needed.

## 2018-10-13 ENCOUNTER — Telehealth: Payer: Self-pay

## 2018-10-13 ENCOUNTER — Telehealth: Payer: Self-pay | Admitting: Neurology

## 2018-10-13 LAB — COMPREHENSIVE METABOLIC PANEL
ALT: 15 IU/L (ref 0–32)
AST: 21 IU/L (ref 0–40)
Albumin/Globulin Ratio: 1.9 (ref 1.2–2.2)
Albumin: 4.7 g/dL (ref 3.8–4.8)
Alkaline Phosphatase: 50 IU/L (ref 39–117)
BUN/Creatinine Ratio: 12 (ref 12–28)
BUN: 10 mg/dL (ref 8–27)
Bilirubin Total: 0.4 mg/dL (ref 0.0–1.2)
CO2: 25 mmol/L (ref 20–29)
Calcium: 10.2 mg/dL (ref 8.7–10.3)
Chloride: 102 mmol/L (ref 96–106)
Creatinine, Ser: 0.85 mg/dL (ref 0.57–1.00)
GFR calc Af Amer: 85 mL/min/{1.73_m2} (ref >59)
GFR calc non Af Amer: 74 mL/min/{1.73_m2} (ref >59)
Globulin, Total: 2.5 g/dL (ref 1.5–4.5)
Glucose: 99 mg/dL (ref 65–99)
Potassium: 5.2 mmol/L (ref 3.5–5.2)
Sodium: 143 mmol/L (ref 134–144)
Total Protein: 7.2 g/dL (ref 6.0–8.5)

## 2018-10-13 NOTE — Telephone Encounter (Signed)
-----   Message from Kathrynn Ducking, MD sent at 10/13/2018  7:07 AM EDT -----  The blood work results are unremarkable. Please call the patient. ----- Message ----- From: Lavone Neri Lab Results In Sent: 10/13/2018   5:38 AM EDT To: Kathrynn Ducking, MD

## 2018-10-13 NOTE — Telephone Encounter (Signed)
[employer] pending faxed notes 

## 2018-10-13 NOTE — Telephone Encounter (Signed)
I reached out to the pt and I was able to advise on lab results. Pt verbalized understanding.

## 2018-10-14 NOTE — Telephone Encounter (Signed)
spoke to the patient right now she is going to hold of and get back to me when ready. She stated her TMJ has started acting up again and she is going to go to her dentist to see if that is where the pain is coming from.  [place]: [id number] (exp. 10/13/18 to 11/27/18)

## 2019-02-24 ENCOUNTER — Other Ambulatory Visit: Payer: Self-pay | Admitting: Obstetrics and Gynecology

## 2019-02-24 DIAGNOSIS — Z9189 Other specified personal risk factors, not elsewhere classified: Secondary | ICD-10-CM

## 2019-04-07 ENCOUNTER — Other Ambulatory Visit: Payer: Self-pay | Admitting: Obstetrics and Gynecology

## 2019-04-07 DIAGNOSIS — Z1231 Encounter for screening mammogram for malignant neoplasm of breast: Secondary | ICD-10-CM

## 2019-05-17 ENCOUNTER — Other Ambulatory Visit: Payer: Self-pay

## 2019-05-17 ENCOUNTER — Ambulatory Visit
Admission: RE | Admit: 2019-05-17 | Discharge: 2019-05-17 | Disposition: A | Discharge status: Home / Self Care | Payer: 59 | Source: Ambulatory Visit | Attending: Obstetrics and Gynecology | Admitting: Obstetrics and Gynecology

## 2019-05-17 DIAGNOSIS — Z1231 Encounter for screening mammogram for malignant neoplasm of breast: Secondary | ICD-10-CM

## 2020-05-24 ENCOUNTER — Other Ambulatory Visit: Payer: Self-pay | Admitting: Obstetrics and Gynecology

## 2020-05-24 DIAGNOSIS — Z0000 Encounter for general adult medical examination without abnormal findings: Secondary | ICD-10-CM

## 2020-05-25 ENCOUNTER — Ambulatory Visit
Admission: RE | Admit: 2020-05-25 | Discharge: 2020-05-25 | Disposition: A | Discharge status: Home / Self Care | Payer: 59 | Source: Ambulatory Visit | Attending: Obstetrics and Gynecology | Admitting: Obstetrics and Gynecology

## 2020-05-25 ENCOUNTER — Other Ambulatory Visit: Payer: Self-pay

## 2020-05-25 DIAGNOSIS — Z0000 Encounter for general adult medical examination without abnormal findings: Secondary | ICD-10-CM

## 2020-08-09 ENCOUNTER — Ambulatory Visit: Payer: Self-pay | Admitting: Orthopedic Surgery

## 2020-09-11 NOTE — Progress Notes (Signed)
DUE TO COVID-19 ONLY ONE VISITOR IS ALLOWED TO COME WITH YOU AND STAY IN THE WAITING ROOM ONLY DURING PRE OP AND PROCEDURE DAY OF SURGERY. THE 1 VISITOR  MAY VISIT WITH YOU AFTER SURGERY IN YOUR PRIVATE ROOM DURING VISITING HOURS ONLY!  YOU NEED TO HAVE A COVID 19 TEST ON__ 09/25/2020 _____ @_______ , THIS TEST MUST BE DONE BEFORE SURGERY,  COVID TESTING SITE [address] [place] , IT IS ON THE RIGHT GOING OUT [address] APPROXIMATELY  2 MINUTES PAST [employer] ON THE RIGHT. ONCE YOUR COVID TEST IS COMPLETED,  PLEASE BEGIN THE QUARANTINE INSTRUCTIONS AS OUTLINED IN YOUR HANDOUT.                Erin Fischer  09/11/2020   Your procedure is scheduled on:     09/27/2020   Report to [facility] Main  Entrance   Report to admitting at     0630am AM     Call this number if you have problems the morning of surgery [phone]    REMEMBER: NO  SOLID FOOD CANDY OR GUM AFTER MIDNIGHT. CLEAR LIQUIDS UNTIL     0530am        . NOTHING BY MOUTH EXCEPT CLEAR LIQUIDS UNTIL    0530am    . PLEASE FINISH ENSURE DRINK PER SURGEON ORDER  WHICH NEEDS TO BE COMPLETED AT   0530am    .      CLEAR LIQUID DIET   Foods Allowed                                                                    Coffee and tea, regular and decaf                            Fruit ices (not with fruit pulp)                                      Iced Popsicles                                    Carbonated beverages, regular and diet                                    Cranberry, grape and apple juices Sports drinks like Gatorade Lightly seasoned clear broth or consume(fat free) Sugar, honey syrup ___________________________________________________________________      BRUSH YOUR TEETH MORNING OF SURGERY AND RINSE YOUR MOUTH OUT, NO CHEWING GUM CANDY OR MINTS.     Take these medicines the morning of surgery with A SIP OF WATER:    Allegra, synthroid, prilosec  DO NOT TAKE ANY DIABETIC  MEDICATIONS DAY OF YOUR SURGERY                               You may not have any metal on your body including hair pins and  piercings  Do not wear jewelry, make-up, lotions, powders or perfumes, deodorant             Do not wear nail polish on your fingernails.  Do not shave  48 hours prior to surgery.              Men may shave face and neck.   Do not bring valuables to the hospital. [place].  Contacts, dentures or bridgework may not be worn into surgery.  Leave suitcase in the car. After surgery it may be brought to your room.     Patients discharged the day of surgery will not be allowed to drive home. IF YOU ARE HAVING SURGERY AND GOING HOME THE SAME DAY, YOU MUST HAVE AN ADULT TO DRIVE YOU HOME AND BE WITH YOU FOR 24 HOURS. YOU MAY GO HOME BY TAXI OR UBER OR ORTHERWISE, BUT AN ADULT MUST ACCOMPANY YOU HOME AND STAY WITH YOU FOR 24 HOURS.  Name and phone number of your driver:  Special Instructions: N/A              Please read over the following fact sheets you were given: _____________________________________________________________________  [facility] - Preparing for Surgery Before surgery, you can play an important role.  Because skin is not sterile, your skin needs to be as free of germs as possible.  You can reduce the number of germs on your skin by washing with CHG (chlorahexidine gluconate) soap before surgery.  CHG is an antiseptic cleaner which kills germs and bonds with the skin to continue killing germs even after washing. Please DO NOT use if you have an allergy to CHG or antibacterial soaps.  If your skin becomes reddened/irritated stop using the CHG and inform your nurse when you arrive at Short Stay. Do not shave (including legs and underarms) for at least 48 hours prior to the first CHG shower.  You may shave your face/neck. Please follow these instructions carefully:  1.  Shower with CHG Soap the night  before surgery and the  morning of Surgery.  2.  If you choose to wash your hair, wash your hair first as usual with your  normal  shampoo.  3.  After you shampoo, rinse your hair and body thoroughly to remove the  shampoo.                           4.  Use CHG as you would any other liquid soap.  You can apply chg directly  to the skin and wash                       Gently with a scrungie or clean washcloth.  5.  Apply the CHG Soap to your body ONLY FROM THE NECK DOWN.   Do not use on face/ open                           Wound or open sores. Avoid contact with eyes, ears mouth and genitals (private parts).                       Wash face,  Genitals (private parts) with your normal soap.             6.  Wash thoroughly, paying special attention to the area where your surgery  will be performed.  7.  Thoroughly rinse your body with warm water from the neck down.  8.  DO NOT shower/wash with your normal soap after using and rinsing off  the CHG Soap.                9.  Pat yourself dry with a clean towel.            10.  Wear clean pajamas.            11.  Place clean sheets on your bed the night of your first shower and do not  sleep with pets. Day of Surgery : Do not apply any lotions/deodorants the morning of surgery.  Please wear clean clothes to the hospital/surgery center.  FAILURE TO FOLLOW THESE INSTRUCTIONS MAY RESULT IN THE CANCELLATION OF YOUR SURGERY PATIENT SIGNATURE_________________________________  NURSE SIGNATURE__________________________________  ________________________________________________________________________

## 2020-09-14 ENCOUNTER — Other Ambulatory Visit: Payer: Self-pay

## 2020-09-14 ENCOUNTER — Encounter (HOSPITAL_COMMUNITY): Payer: Self-pay

## 2020-09-14 ENCOUNTER — Encounter (HOSPITAL_COMMUNITY)
Admission: RE | Admit: 2020-09-14 | Discharge: 2020-09-14 | Disposition: A | Discharge status: Home / Self Care | Payer: BC Managed Care – PPO | Source: Ambulatory Visit | Attending: Specialist | Admitting: Specialist

## 2020-09-14 DIAGNOSIS — Z01812 Encounter for preprocedural laboratory examination: Secondary | ICD-10-CM | POA: Insufficient documentation

## 2020-09-14 HISTORY — DX: Hypothyroidism, unspecified: E03.9

## 2020-09-14 HISTORY — DX: Gastro-esophageal reflux disease without esophagitis: K21.9

## 2020-09-14 HISTORY — DX: Other complications of anesthesia, initial encounter: T88.59XA

## 2020-09-14 HISTORY — DX: Unspecified osteoarthritis, unspecified site: M19.90

## 2020-09-14 HISTORY — DX: Personal history of urinary calculi: Z87.442

## 2020-09-14 LAB — BASIC METABOLIC PANEL
Anion gap: 4 — ABNORMAL LOW (ref 5–15)
BUN: 12 mg/dL (ref 8–23)
CO2: 31 mmol/L (ref 22–32)
Calcium: 9.8 mg/dL (ref 8.9–10.3)
Chloride: 106 mmol/L (ref 98–111)
Creatinine, Ser: 0.71 mg/dL (ref 0.44–1.00)
GFR, Estimated: >60 mL/min (ref >60)
Glucose, Bld: 82 mg/dL (ref 70–99)
Potassium: 4.6 mmol/L (ref 3.5–5.1)
Sodium: 141 mmol/L (ref 135–145)

## 2020-09-14 LAB — CBC
HCT: 44.6 % (ref 36.0–46.0)
Hemoglobin: 14.7 g/dL (ref 12.0–15.0)
MCH: 29.2 pg (ref 26.0–34.0)
MCHC: 33 g/dL (ref 30.0–36.0)
MCV: 88.5 fL (ref 80.0–100.0)
Platelets: 197 10*3/uL (ref 150–400)
RBC: 5.04 MIL/uL (ref 3.87–5.11)
RDW: 12.6 % (ref 11.5–15.5)
WBC: 5.8 10*3/uL (ref 4.0–10.5)
nRBC: 0 % (ref 0.0–0.2)

## 2020-09-14 LAB — URINALYSIS, ROUTINE W REFLEX MICROSCOPIC
Bilirubin Urine: NEGATIVE
Glucose, UA: NEGATIVE mg/dL
Hgb urine dipstick: NEGATIVE
Ketones, ur: NEGATIVE mg/dL
Leukocytes,Ua: NEGATIVE
Nitrite: NEGATIVE
Protein, ur: NEGATIVE mg/dL
Specific Gravity, Urine: 1.005 (ref 1.005–1.030)
pH: 7 (ref 5.0–8.0)

## 2020-09-14 LAB — SURGICAL PCR SCREEN
MRSA, PCR: NEGATIVE
Staphylococcus aureus: NEGATIVE

## 2020-09-14 LAB — PROTIME-INR
INR: 1 (ref 0.8–1.2)
Prothrombin Time: 13.1 seconds (ref 11.4–15.2)

## 2020-09-14 LAB — APTT: aPTT: 30 seconds (ref 24–36)

## 2020-09-25 ENCOUNTER — Other Ambulatory Visit (HOSPITAL_COMMUNITY)
Admission: RE | Admit: 2020-09-25 | Discharge: 2020-09-25 | Disposition: A | Discharge status: Home / Self Care | Payer: BC Managed Care – PPO | Source: Ambulatory Visit | Attending: Specialist | Admitting: Specialist

## 2020-09-25 DIAGNOSIS — Z20822 Contact with and (suspected) exposure to covid-19: Secondary | ICD-10-CM | POA: Diagnosis not present

## 2020-09-25 DIAGNOSIS — Z01812 Encounter for preprocedural laboratory examination: Secondary | ICD-10-CM | POA: Diagnosis not present

## 2020-09-25 LAB — SARS CORONAVIRUS 2 (TAT 6-24 HRS): SARS Coronavirus 2: NEGATIVE

## 2020-09-26 NOTE — Anesthesia Preprocedure Evaluation (Addendum)
Anesthesia Evaluation  Patient identified by MRN, date of birth, ID band Patient awake    Reviewed: Allergy & Precautions, NPO status , Patient's Chart, lab work & pertinent test results  Airway Mallampati: II  TM Distance: >3 FB Neck ROM: Full    Dental no notable dental hx.    Pulmonary neg pulmonary ROS,    Pulmonary exam normal breath sounds clear to auscultation       Cardiovascular negative cardio ROS Normal cardiovascular exam Rhythm:Regular Rate:Normal     Neuro/Psych negative neurological ROS  negative psych ROS   GI/Hepatic Neg liver ROS, GERD  ,  Endo/Other  Hypothyroidism   Renal/GU negative Renal ROS  negative genitourinary   Musculoskeletal negative musculoskeletal ROS (+)   Abdominal   Peds negative pediatric ROS (+)  Hematology negative hematology ROS (+)   Anesthesia Other Findings   Reproductive/Obstetrics negative OB ROS                            Anesthesia Physical Anesthesia Plan  ASA: 2  Anesthesia Plan: Spinal   Post-op Pain Management:  Regional for Post-op pain   Induction: Intravenous  PONV Risk Score and Plan: 2 and Ondansetron, Dexamethasone, Treatment may vary due to age or medical condition and Propofol infusion  Airway Management Planned: Simple Face Mask  Additional Equipment:   Intra-op Plan:   Post-operative Plan:   Informed Consent: I have reviewed the patients History and Physical, chart, labs and discussed the procedure including the risks, benefits and alternatives for the proposed anesthesia with the patient or authorized representative who has indicated his/her understanding and acceptance.     Dental advisory given  Plan Discussed with: CRNA and Surgeon  Anesthesia Plan Comments:         Anesthesia Quick Evaluation

## 2020-09-27 ENCOUNTER — Ambulatory Visit (HOSPITAL_COMMUNITY): Payer: BC Managed Care – PPO | Admitting: Anesthesiology

## 2020-09-27 ENCOUNTER — Observation Stay (HOSPITAL_COMMUNITY)
Admission: RE | Admit: 2020-09-27 | Discharge: 2020-09-29 | Disposition: A | Discharge status: Home / Self Care | Payer: BC Managed Care – PPO | Attending: Specialist | Admitting: Specialist

## 2020-09-27 ENCOUNTER — Ambulatory Visit (HOSPITAL_COMMUNITY): Payer: BC Managed Care – PPO

## 2020-09-27 ENCOUNTER — Other Ambulatory Visit: Payer: Self-pay

## 2020-09-27 ENCOUNTER — Ambulatory Visit: Payer: Self-pay | Admitting: Orthopedic Surgery

## 2020-09-27 ENCOUNTER — Encounter (HOSPITAL_COMMUNITY): Payer: Self-pay | Admitting: Specialist

## 2020-09-27 ENCOUNTER — Encounter (HOSPITAL_COMMUNITY)
Admission: RE | Disposition: A | Discharge status: Home / Self Care | Payer: Self-pay | Source: Home / Self Care | Attending: Specialist

## 2020-09-27 DIAGNOSIS — M1712 Unilateral primary osteoarthritis, left knee: Principal | ICD-10-CM | POA: Diagnosis present

## 2020-09-27 DIAGNOSIS — Z96659 Presence of unspecified artificial knee joint: Secondary | ICD-10-CM

## 2020-09-27 DIAGNOSIS — E039 Hypothyroidism, unspecified: Secondary | ICD-10-CM | POA: Diagnosis not present

## 2020-09-27 HISTORY — PX: TOTAL KNEE ARTHROPLASTY: SHX125

## 2020-09-27 SURGERY — ARTHROPLASTY, KNEE, TOTAL
Anesthesia: Spinal | Site: Knee | Laterality: Left

## 2020-09-27 MED ORDER — METHOCARBAMOL 500 MG PO TABS
500.0000 mg | ORAL_TABLET | Freq: Four times a day (QID) | ORAL | Status: DC | PRN
  Administered 2020-09-27 – 2020-09-29 (×5): 500 mg via ORAL
  Filled 2020-09-27 (×5): qty 1

## 2020-09-27 MED ORDER — METHOCARBAMOL 500 MG PO TABS: 500.0000 mg | ORAL_TABLET | Freq: Three times a day (TID) | ORAL | 1 refills | Status: AC | PRN

## 2020-09-27 MED ORDER — BUPIVACAINE IN DEXTROSE 0.75-8.25 % IT SOLN
INTRATHECAL | Status: DC | PRN
  Administered 2020-09-27: 2 mL via INTRATHECAL

## 2020-09-27 MED ORDER — LACTATED RINGERS IV SOLN: INTRAVENOUS | Status: DC

## 2020-09-27 MED ORDER — OXYCODONE HCL 5 MG/5ML PO SOLN: 5.0000 mg | Freq: Once | ORAL | Status: DC | PRN

## 2020-09-27 MED ORDER — ACETAMINOPHEN 10 MG/ML IV SOLN
1000.0000 mg | INTRAVENOUS | Status: AC
  Administered 2020-09-27: 1000 mg via INTRAVENOUS
  Filled 2020-09-27: qty 100

## 2020-09-27 MED ORDER — ALPRAZOLAM 0.25 MG PO TABS: 0.2500 mg | ORAL_TABLET | Freq: Every day | ORAL | Status: DC | PRN

## 2020-09-27 MED ORDER — ONDANSETRON HCL 4 MG/2ML IJ SOLN
INTRAMUSCULAR | Status: DC | PRN
  Administered 2020-09-27: 4 mg via INTRAVENOUS

## 2020-09-27 MED ORDER — MULTI-VITAMIN/MINERALS PO TABS: 1.0000 | ORAL_TABLET | Freq: Every day | ORAL | Status: DC

## 2020-09-27 MED ORDER — HYDROMORPHONE HCL 1 MG/ML IJ SOLN
0.5000 mg | INTRAMUSCULAR | Status: DC | PRN
  Filled 2020-09-27: qty 1

## 2020-09-27 MED ORDER — PROPOFOL 10 MG/ML IV BOLUS
INTRAVENOUS | Status: AC
  Filled 2020-09-27: qty 20

## 2020-09-27 MED ORDER — OXYCODONE HCL 5 MG PO TABS: 5.0000 mg | ORAL_TABLET | Freq: Once | ORAL | Status: DC | PRN

## 2020-09-27 MED ORDER — ADULT MULTIVITAMIN W/MINERALS CH
1.0000 | ORAL_TABLET | Freq: Every day | ORAL | Status: DC
  Administered 2020-09-28 – 2020-09-29 (×2): 1 via ORAL
  Filled 2020-09-27 (×2): qty 1

## 2020-09-27 MED ORDER — FENTANYL CITRATE (PF) 250 MCG/5ML IJ SOLN
INTRAMUSCULAR | Status: DC | PRN
  Administered 2020-09-27 (×2): 50 ug via INTRAVENOUS

## 2020-09-27 MED ORDER — HYDROMORPHONE HCL 1 MG/ML IJ SOLN: 0.2500 mg | INTRAMUSCULAR | Status: DC | PRN

## 2020-09-27 MED ORDER — PROPOFOL 500 MG/50ML IV EMUL
INTRAVENOUS | Status: DC | PRN
  Administered 2020-09-27: 75 ug/kg/min via INTRAVENOUS

## 2020-09-27 MED ORDER — BUPIVACAINE LIPOSOME 1.3 % IJ SUSP
20.0000 mL | Freq: Once | INTRAMUSCULAR | Status: DC
  Filled 2020-09-27: qty 20

## 2020-09-27 MED ORDER — LORATADINE 10 MG PO TABS
10.0000 mg | ORAL_TABLET | Freq: Every day | ORAL | Status: DC
  Administered 2020-09-28 – 2020-09-29 (×2): 10 mg via ORAL
  Filled 2020-09-27 (×2): qty 1

## 2020-09-27 MED ORDER — METOCLOPRAMIDE HCL 5 MG PO TABS: 5.0000 mg | ORAL_TABLET | Freq: Three times a day (TID) | ORAL | Status: DC | PRN

## 2020-09-27 MED ORDER — ONDANSETRON HCL 4 MG/2ML IJ SOLN
4.0000 mg | Freq: Four times a day (QID) | INTRAMUSCULAR | Status: DC | PRN
  Administered 2020-09-27 – 2020-09-28 (×3): 4 mg via INTRAVENOUS
  Filled 2020-09-27 (×3): qty 2

## 2020-09-27 MED ORDER — MAGNESIUM CITRATE PO SOLN: 1.0000 | Freq: Once | ORAL | Status: DC | PRN

## 2020-09-27 MED ORDER — LEVOTHYROXINE SODIUM 75 MCG PO TABS
75.0000 ug | ORAL_TABLET | Freq: Every day | ORAL | Status: DC
  Administered 2020-09-28 – 2020-09-29 (×2): 75 ug via ORAL
  Filled 2020-09-27 (×2): qty 1

## 2020-09-27 MED ORDER — PANTOPRAZOLE SODIUM 40 MG PO TBEC
40.0000 mg | DELAYED_RELEASE_TABLET | Freq: Every day | ORAL | Status: DC
  Administered 2020-09-28 – 2020-09-29 (×2): 40 mg via ORAL
  Filled 2020-09-27 (×2): qty 1

## 2020-09-27 MED ORDER — ACETAMINOPHEN 10 MG/ML IV SOLN: 1000.0000 mg | Freq: Once | INTRAVENOUS | Status: DC | PRN

## 2020-09-27 MED ORDER — PHENYLEPHRINE HCL-NACL 10-0.9 MG/250ML-% IV SOLN
INTRAVENOUS | Status: DC | PRN
  Administered 2020-09-27: 25 ug/min via INTRAVENOUS
  Administered 2020-09-27: 15 ug/min via INTRAVENOUS

## 2020-09-27 MED ORDER — BUPIVACAINE-EPINEPHRINE (PF) 0.25% -1:200000 IJ SOLN
INTRAMUSCULAR | Status: AC
  Filled 2020-09-27: qty 30

## 2020-09-27 MED ORDER — CHLORHEXIDINE GLUCONATE 0.12 % MT SOLN
15.0000 mL | Freq: Once | OROMUCOSAL | Status: AC
  Administered 2020-09-27: 15 mL via OROMUCOSAL

## 2020-09-27 MED ORDER — PROPOFOL 10 MG/ML IV BOLUS
INTRAVENOUS | Status: DC | PRN
  Administered 2020-09-27 (×2): 20 mg via INTRAVENOUS

## 2020-09-27 MED ORDER — 0.9 % SODIUM CHLORIDE (POUR BTL) OPTIME
TOPICAL | Status: DC | PRN
  Administered 2020-09-27: 1000 mL

## 2020-09-27 MED ORDER — OMEPRAZOLE MAGNESIUM 20 MG PO TBEC: 20.0000 mg | DELAYED_RELEASE_TABLET | Freq: Every day | ORAL | Status: DC

## 2020-09-27 MED ORDER — KCL IN DEXTROSE-NACL 20-5-0.45 MEQ/L-%-% IV SOLN
INTRAVENOUS | Status: AC
  Filled 2020-09-27 (×2): qty 1000

## 2020-09-27 MED ORDER — DOCUSATE SODIUM 100 MG PO CAPS
100.0000 mg | ORAL_CAPSULE | Freq: Two times a day (BID) | ORAL | Status: DC
  Administered 2020-09-27 – 2020-09-29 (×4): 100 mg via ORAL
  Filled 2020-09-27 (×4): qty 1

## 2020-09-27 MED ORDER — BISACODYL 5 MG PO TBEC: 5.0000 mg | DELAYED_RELEASE_TABLET | Freq: Every day | ORAL | Status: DC | PRN

## 2020-09-27 MED ORDER — BUPIVACAINE HCL (PF) 0.5 % IJ SOLN
INTRAMUSCULAR | Status: DC | PRN
  Administered 2020-09-27: 20 mL via PERINEURAL

## 2020-09-27 MED ORDER — RISAQUAD PO CAPS
1.0000 | ORAL_CAPSULE | Freq: Every day | ORAL | Status: DC
  Administered 2020-09-28 – 2020-09-29 (×2): 1 via ORAL
  Filled 2020-09-27 (×2): qty 1

## 2020-09-27 MED ORDER — MIDAZOLAM HCL 2 MG/2ML IJ SOLN
INTRAMUSCULAR | Status: AC
  Filled 2020-09-27: qty 2

## 2020-09-27 MED ORDER — FENTANYL CITRATE (PF) 250 MCG/5ML IJ SOLN
INTRAMUSCULAR | Status: AC
  Filled 2020-09-27: qty 5

## 2020-09-27 MED ORDER — OXYCODONE HCL 5 MG PO TABS: 5.0000 mg | ORAL_TABLET | ORAL | 0 refills | Status: DC | PRN

## 2020-09-27 MED ORDER — PHENYLEPHRINE HCL (PRESSORS) 10 MG/ML IV SOLN
INTRAVENOUS | Status: AC
  Filled 2020-09-27: qty 1

## 2020-09-27 MED ORDER — POLYETHYLENE GLYCOL 3350 17 G PO PACK: 17.0000 g | PACK | Freq: Every day | ORAL | 0 refills | Status: AC

## 2020-09-27 MED ORDER — POLYETHYLENE GLYCOL 3350 17 G PO PACK: 17.0000 g | PACK | Freq: Every day | ORAL | Status: DC | PRN

## 2020-09-27 MED ORDER — METOCLOPRAMIDE HCL 5 MG/ML IJ SOLN
5.0000 mg | Freq: Three times a day (TID) | INTRAMUSCULAR | Status: DC | PRN
  Administered 2020-09-28: 10 mg via INTRAVENOUS
  Filled 2020-09-27: qty 2

## 2020-09-27 MED ORDER — TRANEXAMIC ACID-NACL 1000-0.7 MG/100ML-% IV SOLN
1000.0000 mg | INTRAVENOUS | Status: AC
  Administered 2020-09-27: 1000 mg via INTRAVENOUS
  Filled 2020-09-27: qty 100

## 2020-09-27 MED ORDER — TRAMADOL HCL 50 MG PO TABS
50.0000 mg | ORAL_TABLET | Freq: Four times a day (QID) | ORAL | Status: DC | PRN
  Administered 2020-09-27: 100 mg via ORAL
  Administered 2020-09-28: 50 mg via ORAL
  Administered 2020-09-28: 100 mg via ORAL
  Filled 2020-09-27: qty 2
  Filled 2020-09-27: qty 1
  Filled 2020-09-27: qty 2

## 2020-09-27 MED ORDER — BUPIVACAINE-EPINEPHRINE 0.25% -1:200000 IJ SOLN
INTRAMUSCULAR | Status: DC | PRN
  Administered 2020-09-27: 30 mL

## 2020-09-27 MED ORDER — ASPIRIN 81 MG PO CHEW
81.0000 mg | CHEWABLE_TABLET | Freq: Two times a day (BID) | ORAL | Status: DC
  Administered 2020-09-28 – 2020-09-29 (×3): 81 mg via ORAL
  Filled 2020-09-27 (×3): qty 1

## 2020-09-27 MED ORDER — SODIUM CHLORIDE 0.9 % IR SOLN
Status: DC | PRN
  Administered 2020-09-27: 1000 mL

## 2020-09-27 MED ORDER — IRRISEPT - 450ML BOTTLE WITH 0.05% CHG IN STERILE WATER, USP 99.95% OPTIME
TOPICAL | Status: DC | PRN
  Administered 2020-09-27: 450 mL

## 2020-09-27 MED ORDER — ONDANSETRON HCL 4 MG PO TABS: 4.0000 mg | ORAL_TABLET | Freq: Four times a day (QID) | ORAL | Status: DC | PRN

## 2020-09-27 MED ORDER — ASPIRIN EC 81 MG PO TBEC: 81.0000 mg | DELAYED_RELEASE_TABLET | Freq: Two times a day (BID) | ORAL | 1 refills | Status: AC

## 2020-09-27 MED ORDER — PHENOL 1.4 % MT LIQD: 1.0000 | OROMUCOSAL | Status: DC | PRN

## 2020-09-27 MED ORDER — PHENYLEPHRINE HCL-NACL 10-0.9 MG/250ML-% IV SOLN: INTRAVENOUS | Status: DC | PRN

## 2020-09-27 MED ORDER — ONDANSETRON HCL 4 MG/2ML IJ SOLN: 4.0000 mg | Freq: Once | INTRAMUSCULAR | Status: DC | PRN

## 2020-09-27 MED ORDER — LIDOCAINE 2% (20 MG/ML) 5 ML SYRINGE
INTRAMUSCULAR | Status: DC | PRN
  Administered 2020-09-27: 40 mg via INTRAVENOUS

## 2020-09-27 MED ORDER — MENTHOL 3 MG MT LOZG: 1.0000 | LOZENGE | OROMUCOSAL | Status: DC | PRN

## 2020-09-27 MED ORDER — SODIUM CHLORIDE 0.9% FLUSH
INTRAVENOUS | Status: DC | PRN
  Administered 2020-09-27: 40 mL
  Administered 2020-09-27: 50 mL

## 2020-09-27 MED ORDER — SODIUM CHLORIDE 0.9 % IV SOLN
2.0000 g | INTRAVENOUS | Status: AC
  Administered 2020-09-27: 2 g via INTRAVENOUS
  Filled 2020-09-27: qty 2

## 2020-09-27 MED ORDER — MIDAZOLAM HCL 2 MG/2ML IJ SOLN
INTRAMUSCULAR | Status: DC | PRN
  Administered 2020-09-27 (×2): 1 mg via INTRAVENOUS

## 2020-09-27 MED ORDER — LACTATED RINGERS IV SOLN: INTRAVENOUS | Status: DC | PRN

## 2020-09-27 MED ORDER — BUPIVACAINE LIPOSOME 1.3 % IJ SUSP
INTRAMUSCULAR | Status: DC | PRN
  Administered 2020-09-27: 20 mL

## 2020-09-27 MED ORDER — DEXAMETHASONE SODIUM PHOSPHATE 10 MG/ML IJ SOLN
INTRAMUSCULAR | Status: DC | PRN
  Administered 2020-09-27: 10 mg via INTRAVENOUS

## 2020-09-27 MED ORDER — DIPHENHYDRAMINE HCL 12.5 MG/5ML PO ELIX: 12.5000 mg | ORAL_SOLUTION | ORAL | Status: DC | PRN

## 2020-09-27 MED ORDER — ALUM & MAG HYDROXIDE-SIMETH 200-200-20 MG/5ML PO SUSP: 30.0000 mL | ORAL | Status: DC | PRN

## 2020-09-27 MED ORDER — OXYCODONE HCL 5 MG PO TABS
5.0000 mg | ORAL_TABLET | ORAL | Status: DC | PRN
  Administered 2020-09-27: 10 mg via ORAL
  Filled 2020-09-27: qty 2

## 2020-09-27 MED ORDER — DOCUSATE SODIUM 100 MG PO CAPS: 100.0000 mg | ORAL_CAPSULE | Freq: Two times a day (BID) | ORAL | 1 refills | Status: AC | PRN

## 2020-09-27 MED ORDER — CEFAZOLIN SODIUM-DEXTROSE 1-4 GM/50ML-% IV SOLN
1.0000 g | Freq: Four times a day (QID) | INTRAVENOUS | Status: AC
  Administered 2020-09-27 (×2): 1 g via INTRAVENOUS
  Filled 2020-09-27 (×3): qty 50

## 2020-09-27 SURGICAL SUPPLY — 83 items
AGENT HMST SPONGE THK3/8 (HEMOSTASIS)
ATTUNE PSFEM LTSZ5 NARCEM KNEE (Femur) ×1 IMPLANT
ATTUNE PSRP INSR SZ5 6 KNEE (Insert) ×1 IMPLANT
BAG COUNTER SPONGE SURGICOUNT (BAG) ×1 IMPLANT
BAG DECANTER FOR FLEXI CONT (MISCELLANEOUS) ×2 IMPLANT
BAG SPEC THK2 15X12 ZIP CLS (MISCELLANEOUS)
BAG SPNG CNTER NS LX DISP (BAG) ×1
BAG ZIPLOCK 12X15 (MISCELLANEOUS) IMPLANT
BASE TIBIAL ROT PLAT SZ 5 KNEE (Knees) IMPLANT
BLADE SAW SGTL 11.0X1.19X90.0M (BLADE) ×2 IMPLANT
BLADE SAW SGTL 13.0X1.19X90.0M (BLADE) ×2 IMPLANT
BLADE SURG SZ10 CARB STEEL (BLADE) ×4 IMPLANT
BNDG COHESIVE 4X5 TAN STRL (GAUZE/BANDAGES/DRESSINGS) ×2 IMPLANT
BNDG ELASTIC 4X5.8 VLCR STR LF (GAUZE/BANDAGES/DRESSINGS) ×2 IMPLANT
BNDG ELASTIC 6X5.8 VLCR STR LF (GAUZE/BANDAGES/DRESSINGS) ×2 IMPLANT
BSPLAT TIB 5 CMNT ROT PLAT STR (Knees) ×1 IMPLANT
CEMENT HV SMART SET (Cement) ×4 IMPLANT
COVER SURGICAL LIGHT HANDLE (MISCELLANEOUS) ×2 IMPLANT
CUFF TOURN SGL QUICK 34 (TOURNIQUET CUFF) ×2
CUFF TRNQT CYL 34X4.125X (TOURNIQUET CUFF) ×1 IMPLANT
DECANTER SPIKE VIAL GLASS SM (MISCELLANEOUS) ×1 IMPLANT
DRAPE INCISE IOBAN 66X45 STRL (DRAPES) IMPLANT
DRAPE ORTHO SPLIT 77X108 STRL (DRAPES) ×4
DRAPE SHEET LG 3/4 BI-LAMINATE (DRAPES) ×4 IMPLANT
DRAPE SURG ORHT 6 SPLT 77X108 (DRAPES) ×2 IMPLANT
DRAPE U-SHAPE 47X51 STRL (DRAPES) ×2 IMPLANT
DRSG AQUACEL AG ADV 3.5X10 (GAUZE/BANDAGES/DRESSINGS) ×2 IMPLANT
DRSG TEGADERM 4X4.75 (GAUZE/BANDAGES/DRESSINGS) IMPLANT
DURAPREP 26ML APPLICATOR (WOUND CARE) ×2 IMPLANT
ELECT BLADE TIP CTD 4 INCH (ELECTRODE) ×2 IMPLANT
ELECT REM PT RETURN 15FT ADLT (MISCELLANEOUS) ×2 IMPLANT
EVACUATOR 1/8 PVC DRAIN (DRAIN) IMPLANT
GAUZE SPONGE 2X2 8PLY STRL LF (GAUZE/BANDAGES/DRESSINGS) IMPLANT
GLOVE SRG 8 PF TXTR STRL LF DI (GLOVE) ×1 IMPLANT
GLOVE SURG POLYISO LF SZ7.5 (GLOVE) ×4 IMPLANT
GLOVE SURG POLYISO LF SZ8 (GLOVE) ×4 IMPLANT
GLOVE SURG UNDER POLY LF SZ7.5 (GLOVE) ×2 IMPLANT
GLOVE SURG UNDER POLY LF SZ8 (GLOVE) ×2
GOWN STRL REUS W/TWL XL LVL3 (GOWN DISPOSABLE) ×4 IMPLANT
HANDPIECE INTERPULSE COAX TIP (DISPOSABLE) ×2
HEMOSTAT SPONGE AVITENE ULTRA (HEMOSTASIS) IMPLANT
HOLDER FOLEY CATH W/STRAP (MISCELLANEOUS) ×1 IMPLANT
IMMOBILIZER KNEE 20 (SOFTGOODS) ×2
IMMOBILIZER KNEE 20 THIGH 36 (SOFTGOODS) ×1 IMPLANT
JET LAVAGE IRRISEPT WOUND (IRRIGATION / IRRIGATOR) ×2
KIT TURNOVER KIT A (KITS) ×2 IMPLANT
LAVAGE JET IRRISEPT WOUND (IRRIGATION / IRRIGATOR) ×1 IMPLANT
MANIFOLD NEPTUNE II (INSTRUMENTS) ×2 IMPLANT
NDL SAFETY ECLIPSE 18X1.5 (NEEDLE) IMPLANT
NEEDLE HYPO 18GX1.5 SHARP (NEEDLE)
NS IRRIG 1000ML POUR BTL (IV SOLUTION) ×1 IMPLANT
PACK TOTAL KNEE CUSTOM (KITS) ×2 IMPLANT
PATELLA MEDIAL ATTUN 35MM KNEE (Knees) ×1 IMPLANT
PIN DRILL FIX HALF THREAD (BIT) ×1 IMPLANT
PIN STEINMAN FIXATION KNEE (PIN) ×1 IMPLANT
PROTECTOR NERVE ULNAR (MISCELLANEOUS) ×2 IMPLANT
SAW OSC TIP CART 19.5X105X1.3 (SAW) ×2 IMPLANT
SEALER BIPOLAR AQUA 6.0 (INSTRUMENTS) ×2 IMPLANT
SET HNDPC FAN SPRY TIP SCT (DISPOSABLE) ×1 IMPLANT
SPONGE GAUZE 2X2 STER 10/PKG (GAUZE/BANDAGES/DRESSINGS)
SPONGE SURGIFOAM ABS GEL 100 (HEMOSTASIS) IMPLANT
SPONGE T-LAP 18X18 ~~LOC~~+RFID (SPONGE) ×3 IMPLANT
STAPLER VISISTAT (STAPLE) IMPLANT
STRIP CLOSURE SKIN 1/2X4 (GAUZE/BANDAGES/DRESSINGS) ×2 IMPLANT
SUT BONE WAX W31G (SUTURE) ×2 IMPLANT
SUT MNCRL AB 4-0 PS2 18 (SUTURE) ×1 IMPLANT
SUT STRATAFIX 0 PDS 27 VIOLET (SUTURE) ×2
SUT VIC AB 1 CT1 27 (SUTURE) ×6
SUT VIC AB 1 CT1 27XBRD ANTBC (SUTURE) ×2 IMPLANT
SUT VIC AB 1 CTX 36 (SUTURE)
SUT VIC AB 1 CTX36XBRD ANBCTR (SUTURE) IMPLANT
SUT VIC AB 2-0 CT1 27 (SUTURE) ×6
SUT VIC AB 2-0 CT1 TAPERPNT 27 (SUTURE) ×3 IMPLANT
SUTURE STRATFX 0 PDS 27 VIOLET (SUTURE) ×1 IMPLANT
SYR 3ML LL SCALE MARK (SYRINGE) IMPLANT
SYR 50ML LL SCALE MARK (SYRINGE) ×1 IMPLANT
TIBIAL BASE ROT PLAT SZ 5 KNEE (Knees) ×2 IMPLANT
TOWER CARTRIDGE SMART MIX (DISPOSABLE) ×2 IMPLANT
TRAY FOLEY MTR SLVR 14FR STAT (SET/KITS/TRAYS/PACK) ×1 IMPLANT
TRAY FOLEY MTR SLVR 16FR STAT (SET/KITS/TRAYS/PACK) ×1 IMPLANT
WATER STERILE IRR 1000ML POUR (IV SOLUTION) ×3 IMPLANT
WIPE CHG CHLORHEXIDINE 2% (PERSONAL CARE ITEMS) ×2 IMPLANT
WRAP KNEE MAXI GEL POST OP (GAUZE/BANDAGES/DRESSINGS) ×2 IMPLANT

## 2020-09-27 NOTE — Discharge Instructions (Signed)

## 2020-09-27 NOTE — Anesthesia Procedure Notes (Signed)
Anesthesia Regional Block: Adductor canal block   Pre-Anesthetic Checklist: , timeout performed,  Correct Patient, Correct Site, Correct Laterality,  Correct Procedure, Correct Position, site marked,  Risks and benefits discussed,  Surgical consent,  Pre-op evaluation,  At surgeon's request and post-op pain management  Laterality: Left  Prep: chloraprep       Needles:  Injection technique: Single-shot  Needle Type: Echogenic Needle     Needle Length: 9cm      Additional Needles:   Procedures:,,,, ultrasound used (permanent image in chart),,    Narrative:  Start time: 09/27/2020 7:50 AM End time: 09/27/2020 7:57 AM Injection made incrementally with aspirations every 5 mL.  Performed by: Personally  Anesthesiologist: Eilene Ghazi, MD  Additional Notes: Patient tolerated the procedure well without complications

## 2020-09-27 NOTE — H&P (View-Only) (Signed)
Erin Fischer is an 65 y.o. female.   Chief Complaint: left knee pain HPI: Erin Fischer is here today for her H&P. She is scheduled on 09/27/2020 for a left total knee arthroplasty at [facility] by Dr. Jene Every.  She reports ongoing left knee pain refractory to injection therapy, prior arthroscopy, quad strengthening, bracing, activity modifications and relative rest as well as medications. Pain is interfering with quality-of-life and activities of daily living at this point and she desires to proceed with total knee replacement.  Dr. Shelle Iron and the patient mutually agreed to proceed with a total knee replacement. Risks and benefits of the procedure were discussed including stiffness, suboptimal range of motion, persistent pain, infection requiring removal of prosthesis and reinsertion, need for prophylactic antibiotics in the future, for example, dental procedures, possible need for manipulation, revision in the future and also anesthetic complications including DVT, PE, etc. We discussed the perioperative course, time in the hospital, postoperative recovery and the need for elevation to control swelling. We also discussed the predicted range of motion and the probability that squatting and kneeling would be unobtainable in the future. In addition, postoperative anticoagulation was discussed. We have obtained preoperative medical clearance as necessary. Provided illustrated handout and discussed it in detail. They will enroll in the total joint replacement educational forum at the hospital.  She has had some urinary retention issues postop in the past.  Past Medical History:  Diagnosis Date   Anxiety    Arthritis    Complication of anesthesia    GERD (gastroesophageal reflux disease)    History of kidney stones    Hypothyroidism    TMJ (dislocation of temporomandibular joint)    Trigeminal neuralgia of left side of face     Past Surgical History:  Procedure Laterality Date   BACK  SURGERY  2006   CHOLECYSTECTOMY     torn meniscus left knee surgery       Family History  Problem Relation Age of Onset   Breast cancer Sister 22   Other Mother        Renal Failure    Lung cancer Father    Throat cancer Brother    Atrial fibrillation Brother    Social History:  reports that she has never smoked. She has never used smokeless tobacco. She reports that she does not drink alcohol and does not use drugs.  Allergies: No Known Allergies  (Not in a hospital admission)  Medications Alprazolam synthroid  Results for orders placed or performed during the hospital encounter of 09/25/20 (from the past 48 hour(s))  SARS CORONAVIRUS 2 (TAT 6-24 HRS) Nasopharyngeal Nasopharyngeal Swab     Status: None   Collection Time: 09/25/20 12:57 PM   Specimen: Nasopharyngeal Swab  Result Value Ref Range   SARS Coronavirus 2 NEGATIVE NEGATIVE    Comment: (NOTE) SARS-CoV-2 target nucleic acids are NOT DETECTED.  The SARS-CoV-2 RNA is generally detectable in upper and lower respiratory specimens during the acute phase of infection. Negative results do not preclude SARS-CoV-2 infection, do not rule out co-infections with other pathogens, and should not be used as the sole basis for treatment or other patient management decisions. Negative results must be combined with clinical observations, patient history, and epidemiological information. The expected result is Negative.  Fact Sheet for Patients: HairSlick.no  Fact Sheet for Healthcare Providers: quierodirigir.com  This test is not yet approved or cleared by the [place] FDA and  has been authorized for detection and/or diagnosis of  SARS-CoV-2 by FDA under an Emergency Use Authorization (EUA). This EUA will remain  in effect (meaning this test can be used) for the duration of the COVID-19 declaration under Se ction 564(b)(1) of the Act, 21 U.S.C. section  360bbb-3(b)(1), unless the authorization is terminated or revoked sooner.  Performed at [facility], [address], [place], [place] [address]    No results found.  Review of Systems  Constitutional: Negative.   HENT: Negative.    Eyes: Negative.   Respiratory: Negative.    Cardiovascular: Negative.   Gastrointestinal: Negative.   Endocrine: Negative.   Genitourinary: Negative.   Musculoskeletal:  Positive for arthralgias and myalgias.  Skin: Negative.    There were no vitals taken for this visit. Physical Exam Constitutional:      Appearance: Normal appearance.  HENT:     Head: Normocephalic.     Right Ear: External ear normal.     Left Ear: External ear normal.     Nose: Nose normal.     Mouth/Throat:     Pharynx: Oropharynx is clear.  Eyes:     Conjunctiva/sclera: Conjunctivae normal.  Cardiovascular:     Rate and Rhythm: Normal rate and regular rhythm.  Pulmonary:     Effort: Pulmonary effort is normal.  Abdominal:     General: Bowel sounds are normal.  Musculoskeletal:     Cervical back: Normal range of motion.     Comments: Tender medial joint line. Patellofemoral pain compression. Range 0-1 40. No DVT. Equivocal [place]. Patellofemoral pain  Skin:    General: Skin is warm and dry.  Neurological:     Mental Status: She is alert.     Assessment/Plan Impression: Left knee end-stage osteoarthritis  Plan:Pt with end-stage left knee DJD, bone-on-bone, refractory to conservative tx, scheduled for left total knee replacement by Dr. Shelle Iron on July 13. We again discussed the procedure itself as well as risks, complications and alternatives, including but not limited to DVT, PE, infx, bleeding, failure of procedure, need for secondary procedure including manipulation, nerve injury, ongoing pain/symptoms, anesthesia risk, even stroke or death. Also discussed typical post-op protocols, activity restrictions, need for PT, flexion/extension exercises, time out of  work. Discussed need for DVT ppx post-op per protocol. Discussed dental ppx and infx prevention. Also discussed limitations post-operatively such as kneeling and squatting. All questions were answered. Patient desires to proceed with surgery as scheduled.  Will hold supplements, ASA and NSAIDs accordingly. Will remain NPO after midnight the night before surgery. Will present to [facility] for pre-op testing. Anticipate hospital stay to include at least 2 midnights given medical history and to ensure proper pain control. Plan aspirin for DVT ppx post-op. Plan oxycodone, Robaxin, Colace, Miralax. Plan home with HHPT post-op with family members at home for assistance. Will follow up 10-14 days post-op for suture removal and xrays.  Plan LEft total knee replacement  Dorothy Spark, PA-C for Dr Shelle Iron 09/27/2020, 7:48 AM

## 2020-09-27 NOTE — Anesthesia Procedure Notes (Signed)
Spinal  Patient location during procedure: OR Start time: 09/27/2020 8:37 AM End time: 09/27/2020 8:40 AM Reason for block: surgical anesthesia Staffing Anesthesiologist: Eilene Ghazi, MD Preanesthetic Checklist Completed: patient identified, IV checked, site marked, risks and benefits discussed, surgical consent, monitors and equipment checked, pre-op evaluation and timeout performed Spinal Block Patient position: sitting Prep: DuraPrep Patient monitoring: heart rate, cardiac monitor, continuous pulse ox and blood pressure Approach: midline Location: L3-4 Injection technique: single-shot Needle Needle type: Sprotte  Needle gauge: 24 G Needle length: 9 cm Assessment Sensory level: T6 Events: CSF return

## 2020-09-27 NOTE — Brief Op Note (Signed)
09/27/2020  11:09 AM  PATIENT:  Erin Fischer  65 y.o. female  PRE-OPERATIVE DIAGNOSIS:  Left knee degenerative joint disease  POST-OPERATIVE DIAGNOSIS:  Left knee degenerative joint disease  PROCEDURE:  Procedure(s): TOTAL KNEE ARTHROPLASTY (Left)  SURGEON:  Surgeon(s) and Role:    Jene Every, MD - Primary  PHYSICIAN ASSISTANT:   ASSISTANTS: [place]   ANESTHESIA:   spinal  EBL:  50 mL   BLOOD ADMINISTERED:none  DRAINS: none   LOCAL MEDICATIONS USED:  MARCAINE     SPECIMEN:  No Specimen  DISPOSITION OF SPECIMEN:  N/A  COUNTS:  YES  TOURNIQUET:   Total Tourniquet Time Documented: Thigh (Left) - 80 minutes Total: Thigh (Left) - 80 minutes   DICTATION: .Other Dictation: Dictation Number   [id number]  PLAN OF CARE: Admit for overnight observation  PATIENT DISPOSITION:  PACU - hemodynamically stable.   Delay start of Pharmacological VTE agent (>24hrs) due to surgical blood loss or risk of bleeding: no

## 2020-09-27 NOTE — Interval H&P Note (Signed)
History and Physical Interval Note:  09/27/2020 8:03 AM  Erin Fischer  has presented today for surgery, with the diagnosis of Left knee degenerative joint disease.  The various methods of treatment have been discussed with the patient and family. After consideration of risks, benefits and other options for treatment, the patient has consented to  Procedure(s): TOTAL KNEE ARTHROPLASTY (Left) as a surgical intervention.  The patient's history has been reviewed, patient examined, no change in status, stable for surgery.  I have reviewed the patient's chart and labs.  Questions were answered to the patient's satisfaction.     Javier Docker

## 2020-09-27 NOTE — Transfer of Care (Signed)
Immediate Anesthesia Transfer of Care Note  Patient: Erin Fischer  Procedure(s) Performed: TOTAL KNEE ARTHROPLASTY (Left: Knee)  Patient Location: PACU  Anesthesia Type:Spinal  Level of Consciousness: awake and alert   Airway & Oxygen Therapy: Patient Spontanous Breathing and Patient connected to face mask oxygen  Post-op Assessment: Report given to RN and Post -op Vital signs reviewed and stable  Post vital signs: Reviewed and stable  Last Vitals:  Vitals Value Taken Time  BP    Temp    Pulse    Resp    SpO2      Last Pain:  Vitals:   09/27/20 0709  TempSrc:   PainSc: 0-No pain         Complications: No notable events documented.

## 2020-09-27 NOTE — Anesthesia Procedure Notes (Signed)
Anesthesia Procedure Image    

## 2020-09-27 NOTE — Anesthesia Postprocedure Evaluation (Signed)
Anesthesia Post Note  Patient: Erin Fischer  Procedure(s) Performed: TOTAL KNEE ARTHROPLASTY (Left: Knee)     Patient location during evaluation: PACU Anesthesia Type: Spinal Level of consciousness: oriented and awake and alert Pain management: pain level controlled Vital Signs Assessment: post-procedure vital signs reviewed and stable Respiratory status: spontaneous breathing, respiratory function stable and patient connected to nasal cannula oxygen Cardiovascular status: blood pressure returned to baseline and stable Postop Assessment: no headache, no backache and no apparent nausea or vomiting Anesthetic complications: no   No notable events documented.  Last Vitals:  Vitals:   09/27/20 1455 09/27/20 1521  BP:  (!) 151/81  Pulse:  67  Resp:  17  Temp:  36.5 C  SpO2: 97% 99%    Last Pain:  Vitals:   09/27/20 1242  TempSrc: Oral  PainSc: 0-No pain                 Blayne Frankie S

## 2020-09-27 NOTE — Op Note (Signed)
NAME: Erin Fischer, Erin Fischer MEDICAL RECORD NO: [id number] ACCOUNT NO: [id number] DATE OF BIRTH: 26-Sep-1955 FACILITY: [facility] LOCATION: [facility] PHYSICIAN: Javier Docker, MD  Operative Report   DATE OF PROCEDURE: 09/27/2020  PREOPERATIVE DIAGNOSIS:  End-stage osteoarthrosis, left knee.  POSTOPERATIVE DIAGNOSIS:  End-stage osteoarthrosis, left knee.  PROCEDURE PERFORMED:  Left total knee arthroplasty utilizing DePuy Attune rotating platform, 5 femur, 5 tibia, 6 mm insert, 32 patella.  ANESTHESIA:  Spinal  ASSISTANT:  Andrez Grime, PA  HISTORY:  This is a 65 year old female with end-stage osteoarthrosis, medial compartment of left knee, severe pain refractory to conservative treatment, indicated for replacement of the degenerated joint.  Risks and benefits were discussed including  bleeding, infection, damage to neurovascular structures, no change in symptoms or worsening symptoms, DVT, PE, anesthetic complications, etc.  TECHNIQUE:  The patient in supine position.  After induction of adequate spinal anesthesia, 1 gram Kefzol, left lower extremity was prepped and draped, exsanguinated in usual sterile fashion.  Thigh tourniquet inflated to 225 mmHg.  Midline incision was  then made over the knee.  Full thickness flaps developed.  Median parapatellar arthrotomy was performed.  Elevated soft tissues medially, preserving the MCL.  Removing osteophytes of the medial tibial plateau with a Beyer rongeur.  Everted the patella.  Knee was then flexed.  Severe osteoarthrosis noted in the medial compartment, patellofemoral joint.  Remnants of the medial and lateral menisci and the ACL were removed.  I debrided the removed portion of the fat pad.  I used a Gaffer to perform  a notch above the notch for entry to the femoral canal.  The femoral canal was entered with a drill, in line with the femur, irrigated and T-handle placed in a 5-degree left, with 9 off the distal femur.  This was then pinned and  performed distal  femoral cut.  Following this, we sized the femur off the anterior cortex to 4.5.  This was then pinned in 3 degrees of external rotation.  I placed a 4 block on the distal femur and it with measurement would have notched anteriorly, I upsized it to a 5  and that seemed to be a satisfactory sizing for the distal femur.  This was then pinned.  I performed the anterior, posterior and chamfer cuts, with soft tissues protected posteriorly at all times with a curved Crego.  Again, this was sized off the  anterior cortex.  I then performed the chamfer cuts.  I then turned attention to the femur, it was subluxed, severe wear medially, eburnated bone, 2 off the defect which was anterior medial.  A 3-degree slope bisecting the tibiotalar joint parallel to  the shaft.  This was then pinned.  Then, I performed a proximal tibial cut, soft tissues protected.  I then used a trial insert at a 5 and it was in full extension, without difficulty and in flexion.  I then re-flexed the knee, subluxed the tibia, sized  it to a 5, maximizing the coverage.  I then secured this harvested bone centrally and impacted into the femur.  I then drilled centrally, placed our punch guide.  Following this, we turned our attention back to the femur.  I placed the box cut jig off  the distal femur.  This box cut was then performed and rasped.  I placed the trial femur and this fully seated as did the tibia.  Multiple attempts were made to insert a 5 mm insert.  It was tight in both flexion and extension.  I then subluxed the  tibia, removed the baseplate placed pins in the pinholes and replaced those, placed the tibial block guide and then removed an additional 2 mm off the proximal tibia.  I then replaced the punch guide, replaced the tibial tray with the thin and then replaced the trial  femur and placed a 5 insert and it inserted well and reduced.  I had perhaps full extension and full flexion and good stability to varus,  valgus stressing at 0 and 30 degrees.  Negative anterior drawer.  I then turned attention towards the patella, it  was fairly small and thin.  It measured to a 22 perhaps.  There was some beveling of it medially.  I used the external patellar jig, clamped it and performed our patellar cut, it was 14 mm residual remaining.  Only the 32 patellar button, patellar  component would be appropriate.  I then drilled 3 peg holes, medializing those with the patellar template paddle used parallel to the joint.  I then inserted the trial 32 and reduced it and had excellent patellofemoral tracking.  Next, all trials were  removed.  I checked posteriorly, cauterized geniculates.  Capsule was intact.  Popliteus was intact.  Pulsatile lavage was used to clean the joint and the bony surfaces, it was then flexed, subluxed with a McHale, thoroughly dried.  Mixed cement on the  back table in the appropriate fashion under vacuum.  I then placed them on the tibial tray and injected the cement in the tibial canal and digitally pressurizing the cement.  I then cemented and impacted the tibia, redundant cement removed.  I cemented  and impacted the femur with cement on the femoral component.  I then cemented and clamped the patella.  I placed a 5 insert and reduced it, held in axial load, in extension throughout the curing of the cement.  Redundant cement removed.  I placed 0.25%  Marcaine with epinephrine into the joint during curing of the cement.  After curing of the cement, tourniquet was deflated at 79 minutes.  There was minor bleeding.  This was cauterized.  We used the Aquamantys.  With flexion and extension, there was  some slight hyperextension and slight motion in flexion and an anterior drawer.  I therefore removed a 5 and tried a 6, the 6 was a better fit with full extension and no instability with anterior drawer and flexion.  Good stability to varus, valgus  stressing at 0 and 30 degrees.  This was then removed.   Pulsatile lavage used to clean the joint and antibiotic irrigation.  We then placed a 6 permanent insert and reduced the knee and had full extension, full flexion, good stability to varus, valgus  stressing at 0 and 30 degrees, negative anterior drawer.  Prior to placing this, I meticulously removed all redundant cement with an osteotome.  Next, the arthrotomy was closed with #1 Vicryl in interrupted figure-of-eight sutures, starting with the knee  in slight flexion.  This was oversewn with a running Stratafix.  I placed Exparel within the joint, then irrigating and subcutaneous tissues closed with 2-0 and skin with Prolene.  Sterile dressing applied, placed in immobilizer and then transported to  the recovery room in satisfactory condition.  The patient tolerated the procedure well.  No complications.  Assistant, Andrez Grime, [place].  BLOOD LOSS:  50 mL.   SHW D: 09/27/2020 11:20:01 am T: 09/27/2020 12:18:00 pm  JOB: [id number]/ [id number]

## 2020-09-27 NOTE — H&P (Signed)
Erin Fischer is an 65 y.o. female.   Chief Complaint: left knee pain HPI: Erin Fischer is here today for her H&P. She is scheduled on 09/27/2020 for a left total knee arthroplasty at [facility] by Dr. Jene Every.  She reports ongoing left knee pain refractory to injection therapy, prior arthroscopy, quad strengthening, bracing, activity modifications and relative rest as well as medications. Pain is interfering with quality-of-life and activities of daily living at this point and she desires to proceed with total knee replacement.  Dr. Shelle Iron and the patient mutually agreed to proceed with a total knee replacement. Risks and benefits of the procedure were discussed including stiffness, suboptimal range of motion, persistent pain, infection requiring removal of prosthesis and reinsertion, need for prophylactic antibiotics in the future, for example, dental procedures, possible need for manipulation, revision in the future and also anesthetic complications including DVT, PE, etc. We discussed the perioperative course, time in the hospital, postoperative recovery and the need for elevation to control swelling. We also discussed the predicted range of motion and the probability that squatting and kneeling would be unobtainable in the future. In addition, postoperative anticoagulation was discussed. We have obtained preoperative medical clearance as necessary. Provided illustrated handout and discussed it in detail. They will enroll in the total joint replacement educational forum at the hospital.  She has had some urinary retention issues postop in the past.  Past Medical History:  Diagnosis Date   Anxiety    Arthritis    Complication of anesthesia    GERD (gastroesophageal reflux disease)    History of kidney stones    Hypothyroidism    TMJ (dislocation of temporomandibular joint)    Trigeminal neuralgia of left side of face     Past Surgical History:  Procedure Laterality Date   BACK  SURGERY  2006   CHOLECYSTECTOMY     torn meniscus left knee surgery       Family History  Problem Relation Age of Onset   Breast cancer Sister 22   Other Mother        Renal Failure    Lung cancer Father    Throat cancer Brother    Atrial fibrillation Brother    Social History:  reports that she has never smoked. She has never used smokeless tobacco. She reports that she does not drink alcohol and does not use drugs.  Allergies: No Known Allergies  (Not in a hospital admission)  Medications Alprazolam synthroid  Results for orders placed or performed during the hospital encounter of 09/25/20 (from the past 48 hour(s))  SARS CORONAVIRUS 2 (TAT 6-24 HRS) Nasopharyngeal Nasopharyngeal Swab     Status: None   Collection Time: 09/25/20 12:57 PM   Specimen: Nasopharyngeal Swab  Result Value Ref Range   SARS Coronavirus 2 NEGATIVE NEGATIVE    Comment: (NOTE) SARS-CoV-2 target nucleic acids are NOT DETECTED.  The SARS-CoV-2 RNA is generally detectable in upper and lower respiratory specimens during the acute phase of infection. Negative results do not preclude SARS-CoV-2 infection, do not rule out co-infections with other pathogens, and should not be used as the sole basis for treatment or other patient management decisions. Negative results must be combined with clinical observations, patient history, and epidemiological information. The expected result is Negative.  Fact Sheet for Patients: HairSlick.no  Fact Sheet for Healthcare Providers: quierodirigir.com  This test is not yet approved or cleared by the [place] FDA and  has been authorized for detection and/or diagnosis of  SARS-CoV-2 by FDA under an Emergency Use Authorization (EUA). This EUA will remain  in effect (meaning this test can be used) for the duration of the COVID-19 declaration under Se ction 564(b)(1) of the Act, 21 U.S.C. section  360bbb-3(b)(1), unless the authorization is terminated or revoked sooner.  Performed at [facility], [address] [place], [place] [address]    No results found.  Review of Systems  Constitutional: Negative.   HENT: Negative.    Eyes: Negative.   Respiratory: Negative.    Cardiovascular: Negative.   Gastrointestinal: Negative.   Endocrine: Negative.   Genitourinary: Negative.   Musculoskeletal:  Positive for arthralgias and myalgias.  Skin: Negative.    There were no vitals taken for this visit. Physical Exam Constitutional:      Appearance: Normal appearance.  HENT:     Head: Normocephalic.     Right Ear: External ear normal.     Left Ear: External ear normal.     Nose: Nose normal.     Mouth/Throat:     Pharynx: Oropharynx is clear.  Eyes:     Conjunctiva/sclera: Conjunctivae normal.  Cardiovascular:     Rate and Rhythm: Normal rate and regular rhythm.  Pulmonary:     Effort: Pulmonary effort is normal.  Abdominal:     General: Bowel sounds are normal.  Musculoskeletal:     Cervical back: Normal range of motion.     Comments: Tender medial joint line. Patellofemoral pain compression. Range 0-1 40. No DVT. Equivocal McMurray. Patellofemoral pain  Skin:    General: Skin is warm and dry.  Neurological:     Mental Status: She is alert.     Assessment/Plan Impression: Left knee end-stage osteoarthritis  Plan:Pt with end-stage left knee DJD, bone-on-bone, refractory to conservative tx, scheduled for left total knee replacement by Dr. Beane on July 13. We again discussed the procedure itself as well as risks, complications and alternatives, including but not limited to DVT, PE, infx, bleeding, failure of procedure, need for secondary procedure including manipulation, nerve injury, ongoing pain/symptoms, anesthesia risk, even stroke or death. Also discussed typical post-op protocols, activity restrictions, need for PT, flexion/extension exercises, time out of  work. Discussed need for DVT ppx post-op per protocol. Discussed dental ppx and infx prevention. Also discussed limitations post-operatively such as kneeling and squatting. All questions were answered. Patient desires to proceed with surgery as scheduled.  Will hold supplements, ASA and NSAIDs accordingly. Will remain NPO after midnight the night before surgery. Will present to [facility] for pre-op testing. Anticipate hospital stay to include at least 2 midnights given medical history and to ensure proper pain control. Plan aspirin for DVT ppx post-op. Plan oxycodone, Robaxin, Colace, Miralax. Plan home with HHPT post-op with family members at home for assistance. Will follow up 10-14 days post-op for suture removal and xrays.  Plan LEft total knee replacement  Eddie Koc M Ajwa Kimberley, PA-C for Dr Beane 09/27/2020, 7:48 AM    

## 2020-09-27 NOTE — Anesthesia Procedure Notes (Signed)
Date/Time: 09/27/2020 7:29 AM Performed by: Minerva Ends, CRNA Pre-anesthesia Checklist: Patient identified, Emergency Drugs available, Suction available, Patient being monitored and Timeout performed Patient Re-evaluated:Patient Re-evaluated prior to induction Oxygen Delivery Method: Simple face mask Placement Confirmation: positive ETCO2 and breath sounds checked- equal and bilateral Dental Injury: Teeth and Oropharynx as per pre-operative assessment

## 2020-09-27 NOTE — Anesthesia Procedure Notes (Signed)
Date/Time: 09/27/2020 7:46 AM Performed by: Minerva Ends, CRNA Pre-anesthesia Checklist: Patient identified, Emergency Drugs available, Suction available, Patient being monitored and Timeout performed Oxygen Delivery Method: Nasal cannula Placement Confirmation: positive ETCO2 and breath sounds checked- equal and bilateral Dental Injury: Teeth and Oropharynx as per pre-operative assessment

## 2020-09-27 NOTE — Evaluation (Signed)
Physical Therapy Evaluation Patient Details Name: Erin Fischer MRN: [id number] DOB: 28-Sep-1955 Today's Date: 09/27/2020   History of Present Illness  Patient is 65 y.o. female s/p Lt TKA on 09/27/20 with PMH significant for OA, GERD, hypothyroidism, anxiety, back surgery.    Clinical Impression  Erin Fischer is a 65 y.o. female POD 0 s/p Lt TKA. Patient reports independence with mobility at baseline. Patient is now limited by functional impairments (see PT problem list below) and requires min assist for bed mobility and transfers with RW. Patient was able to take several small step to move bed>chair with RW and min assist, however further gait deferred due to nausea and pain. Patient instructed in exercise to facilitate ROM and circulation to manage edema and reduce risk of DVT. Patient will benefit from continued skilled PT interventions to address impairments and progress towards PLOF. Acute PT will follow to progress mobility and stair training in preparation for safe discharge home.     Follow Up Recommendations Home health PT;Follow surgeon's recommendation for DC plan and follow-up therapies    Equipment Recommendations  None recommended by PT    Recommendations for Other Services       Precautions / Restrictions Precautions Precautions: Fall Restrictions Weight Bearing Restrictions: No LLE Weight Bearing: Weight bearing as tolerated      Mobility  Bed Mobility Overal bed mobility: Needs Assistance Bed Mobility: Supine to Sit     Supine to sit: Min assist     General bed mobility comments: cues for use of bed rail, assist to bring Lt LE off EOB.    Transfers Overall transfer level: Needs assistance Equipment used: Rolling walker (2 wheeled) Transfers: Sit to/from [employer] Sit to Stand: Min assist Stand pivot transfers: Min assist       General transfer comment: cues for technique with RW, no overt LOB noted. pt required min assist for power  up and to steady. cues for sequencing steps bed>recliner and assist to manage walker position.  Ambulation/Gait             General Gait Details: deferred further gait due to nausea and prior bout of emesis.  Stairs            Wheelchair Mobility    Modified Rankin (Stroke Patients Only)       Balance Overall balance assessment: Needs assistance Sitting-balance support: Feet supported Sitting balance-Leahy Scale: Good     Standing balance support: During functional activity;Bilateral upper extremity supported Standing balance-Leahy Scale: Poor                               Pertinent Vitals/Pain Pain Assessment: Faces Faces Pain Scale: Hurts even more Pain Location: Lt knee and nauseous Pain Descriptors / Indicators: Aching;Discomfort Pain Intervention(s): Limited activity within patient's tolerance;Monitored during session;Repositioned;Premedicated before session;Ice applied    Home Living Family/patient expects to be discharged to:: Private residence Living Arrangements: Spouse/significant other Available Help at Discharge: Family Type of Home: House Home Access: Stairs to enter Entrance Stairs-Rails: None Entrance Stairs-Number of Steps: 2 Home Layout: One level Home Equipment: Environmental consultant - 2 wheels;Cane - single point;Bedside commode;Shower seat      Prior Function Level of Independence: Independent               Hand Dominance   Dominant Hand: Left    Extremity/Trunk Assessment   Upper Extremity Assessment Upper Extremity Assessment: Overall WFL for tasks assessed  Lower Extremity Assessment Lower Extremity Assessment: LLE deficits/detail LLE Deficits / Details: good quad activation, no extensor lag with SLR LLE Sensation: WNL LLE Coordination: WNL    Cervical / Trunk Assessment Cervical / Trunk Assessment: Normal  Communication   Communication: No difficulties  Cognition Arousal/Alertness: Awake/alert Behavior During  Therapy: WFL for tasks assessed/performed Overall Cognitive Status: Within Functional Limits for tasks assessed                                        General Comments      Exercises Total Joint Exercises Ankle Circles/Pumps: AROM;Both;20 reps;Seated Heel Slides: AROM;5 reps;Left   Assessment/Plan    PT Assessment Patient needs continued PT services  PT Problem List Decreased strength;Decreased range of motion;Decreased activity tolerance;Decreased mobility;Decreased balance;Decreased knowledge of use of DME;Decreased knowledge of precautions;Pain       PT Treatment Interventions DME instruction;Gait training;Stair training;Therapeutic activities;Functional mobility training;Therapeutic exercise;Balance training;Neuromuscular re-education;Patient/family education    PT Goals (Current goals can be found in the Care Plan section)  Acute Rehab PT Goals Patient Stated Goal: stop hurting and regain independence and walk without pain PT Goal Formulation: With patient Time For Goal Achievement: 10/04/20 Potential to Achieve Goals: Good    Frequency 7X/week   Barriers to discharge        Co-evaluation               AM-PAC PT "6 Clicks" Mobility  Outcome Measure Help needed turning from your back to your side while in a flat bed without using bedrails?: A Little Help needed moving from lying on your back to sitting on the side of a flat bed without using bedrails?: A Little Help needed moving to and from a bed to a chair (including a wheelchair)?: A Little Help needed standing up from a chair using your arms (e.g., wheelchair or bedside chair)?: A Little Help needed to walk in hospital room?: A Little Help needed climbing 3-5 steps with a railing? : A Lot 6 Click Score: 17    End of Session Equipment Utilized During Treatment: Gait belt Activity Tolerance: Patient tolerated treatment well (limited by nausea, no emesis during session and  premedicated) Patient left: in chair;with call bell/phone within reach;with chair alarm set;with family/visitor present Nurse Communication: Mobility status PT Visit Diagnosis: Muscle weakness (generalized) (M62.81);Difficulty in walking, not elsewhere classified (R26.2);Pain Pain - Right/Left: Left Pain - part of body: Knee    Time: 5465-0354 PT Time Calculation (min) (ACUTE ONLY): 19 min   Charges:   PT Evaluation $PT Eval Low Complexity: 1 Low          Wynn Maudlin, DPT Acute Rehabilitation Services Office [phone] Pager [phone]   Anitra Lauth 09/27/2020, 5:08 PM

## 2020-09-28 ENCOUNTER — Encounter (HOSPITAL_COMMUNITY): Payer: Self-pay | Admitting: Specialist

## 2020-09-28 DIAGNOSIS — M1712 Unilateral primary osteoarthritis, left knee: Secondary | ICD-10-CM | POA: Diagnosis not present

## 2020-09-28 LAB — CBC
HCT: 39.4 % (ref 36.0–46.0)
Hemoglobin: 13.1 g/dL (ref 12.0–15.0)
MCH: 29.1 pg (ref 26.0–34.0)
MCHC: 33.2 g/dL (ref 30.0–36.0)
MCV: 87.6 fL (ref 80.0–100.0)
Platelets: 180 10*3/uL (ref 150–400)
RBC: 4.5 MIL/uL (ref 3.87–5.11)
RDW: 12.3 % (ref 11.5–15.5)
WBC: 13.9 10*3/uL — ABNORMAL HIGH (ref 4.0–10.5)
nRBC: 0 % (ref 0.0–0.2)

## 2020-09-28 LAB — BASIC METABOLIC PANEL
Anion gap: 8 (ref 5–15)
BUN: 9 mg/dL (ref 8–23)
CO2: 23 mmol/L (ref 22–32)
Calcium: 8.8 mg/dL — ABNORMAL LOW (ref 8.9–10.3)
Chloride: 105 mmol/L (ref 98–111)
Creatinine, Ser: 0.76 mg/dL (ref 0.44–1.00)
GFR, Estimated: >60 mL/min (ref >60)
Glucose, Bld: 152 mg/dL — ABNORMAL HIGH (ref 70–99)
Potassium: 4.1 mmol/L (ref 3.5–5.1)
Sodium: 136 mmol/L (ref 135–145)

## 2020-09-28 MED ORDER — KETOROLAC TROMETHAMINE 10 MG PO TABS
10.0000 mg | ORAL_TABLET | Freq: Four times a day (QID) | ORAL | Status: DC | PRN
  Filled 2020-09-28: qty 1

## 2020-09-28 MED ORDER — KETOROLAC TROMETHAMINE 15 MG/ML IJ SOLN
7.5000 mg | Freq: Four times a day (QID) | INTRAMUSCULAR | Status: DC | PRN
  Administered 2020-09-29: 7.5 mg via INTRAVENOUS
  Filled 2020-09-28: qty 1

## 2020-09-28 MED ORDER — ACETAMINOPHEN 500 MG PO TABS
1000.0000 mg | ORAL_TABLET | Freq: Four times a day (QID) | ORAL | Status: DC
  Administered 2020-09-28 – 2020-09-29 (×5): 1000 mg via ORAL
  Filled 2020-09-28 (×5): qty 2

## 2020-09-28 MED ORDER — KETOROLAC TROMETHAMINE 15 MG/ML IJ SOLN
7.5000 mg | Freq: Four times a day (QID) | INTRAMUSCULAR | Status: DC
  Administered 2020-09-28 – 2020-09-29 (×4): 7.5 mg via INTRAVENOUS
  Filled 2020-09-28 (×4): qty 1

## 2020-09-28 MED ORDER — ACETAMINOPHEN 325 MG PO TABS: 650.0000 mg | ORAL_TABLET | Freq: Four times a day (QID) | ORAL | Status: DC | PRN

## 2020-09-28 NOTE — TOC Transition Note (Signed)
Transition of Care [facility]) - CM/SW Discharge Note  Patient Details  Name: Erin Fischer MRN: [id number] Date of Birth: 29-Mar-1955  [facility] of Care [facility] CM/SW Contact:  Ewing Schlein, LCSW Phone Number: 09/28/2020, 3:17 PM  Clinical Narrative: Patient is expected to discharge home with HHPT, which has not been prearranged. Patient has a rolling walker and 3N1 at home, so there are no DME needs at this time. CSW made [facility] referrals to the following agencies:  [facility]: declined [facility]: declined [facility]: out of network [facility]: declined [facility]: accepted  HH orders are in. [facility] to schedule initial visit with patient after discharge. Patient updated. TOC signing off.  Final next level of care: Home w Home Health Services Barriers to Discharge: No Barriers Identified  Patient Goals and CMS Choice Patient states their goals for this hospitalization and ongoing recovery are:: Discharge home with HHPT CMS Medicare.gov Compare Post Acute Care list provided to:: Patient Choice offered to / list presented to : Patient  Discharge Plan and Services         DME Arranged: N/A DME Agency: NA HH Arranged: PT HH Agency: [facility] Date [facility] Agency Contacted: 09/28/20 Time HH Agency Contacted: 1424 Representative spoke with at [facility] Agency: Kathlene November  Readmission Risk Interventions No flowsheet data found.

## 2020-09-28 NOTE — Progress Notes (Signed)
Physical Therapy Treatment Patient Details Name: Erin Fischer MRN: [id number] DOB: 1955-06-05 Today's Date: 09/28/2020    History of Present Illness Patient is 65 y.o. female s/p Lt TKA on 09/27/20 with PMH significant for OA, GERD, hypothyroidism, anxiety, back surgery.    PT Comments    Pt very cooperative and with marked improvement in activity tolerance with improved pain control and decreased nausea.  Pt up to ambulate increased distance in hall and negotiated steps - spouse present for session.   Follow Up Recommendations  Home health PT;Follow surgeon's recommendation for DC plan and follow-up therapies     Equipment Recommendations  None recommended by PT    Recommendations for Other Services       Precautions / Restrictions Precautions Precautions: Fall Restrictions Weight Bearing Restrictions: No LLE Weight Bearing: Weight bearing as tolerated    Mobility  Bed Mobility Overal bed mobility: Needs Assistance Bed Mobility: Supine to Sit;Sit to Supine     Supine to sit: Supervision Sit to supine: Supervision   General bed mobility comments: Increased time but no physical assist    Transfers Overall transfer level: Needs assistance Equipment used: Rolling walker (2 wheeled) Transfers: Sit to/from Stand Sit to Stand: Min guard;Supervision         General transfer comment: cues for LE management and use of UEs to self assist.  Ambulation/Gait Ambulation/Gait assistance: Min guard;Supervision Gait Distance (Feet): 125 Feet Assistive device: Rolling walker (2 wheeled) Gait Pattern/deviations: Step-to pattern;Decreased step length - right;Decreased step length - left;Shuffle;Trunk flexed Gait velocity: decr   General Gait Details: cues for sequence, posture, position from RW   Stairs Stairs: Yes Stairs assistance: Min assist Stair Management: No rails;Step to pattern;Backwards;With walker Number of Stairs: 3 General stair comments: single step 3x  bkwd with cues for sequence and foot/RW placement   Wheelchair Mobility    Modified Rankin (Stroke Patients Only)       Balance Overall balance assessment: Needs assistance Sitting-balance support: Feet supported Sitting balance-Leahy Scale: Good     Standing balance support: No upper extremity supported Standing balance-Leahy Scale: Fair                              Cognition Arousal/Alertness: Awake/alert Behavior During Therapy: WFL for tasks assessed/performed Overall Cognitive Status: Within Functional Limits for tasks assessed                                        Exercises      General Comments        Pertinent Vitals/Pain Pain Assessment: 0-10 Pain Score: 4  Pain Location: Lt knee and thigh Pain Descriptors / Indicators: Aching;Discomfort Pain Intervention(s): Limited activity within patient's tolerance;Monitored during session;Premedicated before session;Ice applied    Home Living                      Prior Function            PT Goals (current goals can now be found in the care plan section) Acute Rehab PT Goals Patient Stated Goal: stop hurting and regain independence and walk without pain PT Goal Formulation: With patient Time For Goal Achievement: 10/04/20 Potential to Achieve Goals: Good Progress towards PT goals: Progressing toward goals    Frequency    7X/week      PT Plan Current  plan remains appropriate    Co-evaluation              AM-PAC PT "6 Clicks" Mobility   Outcome Measure  Help needed turning from your back to your side while in a flat bed without using bedrails?: A Little Help needed moving from lying on your back to sitting on the side of a flat bed without using bedrails?: A Little Help needed moving to and from a bed to a chair (including a wheelchair)?: A Little Help needed standing up from a chair using your arms (e.g., wheelchair or bedside chair)?: A Little Help  needed to walk in hospital room?: A Little Help needed climbing 3-5 steps with a railing? : A Little 6 Click Score: 18    End of Session Equipment Utilized During Treatment: Gait belt Activity Tolerance: Patient tolerated treatment well Patient left: in bed;with call bell/phone within reach;with bed alarm set;with family/visitor present Nurse Communication: Mobility status PT Visit Diagnosis: Muscle weakness (generalized) (M62.81);Difficulty in walking, not elsewhere classified (R26.2);Pain Pain - Right/Left: Left Pain - part of body: Knee     Time: 1427-1450 PT Time Calculation (min) (ACUTE ONLY): 23 min  Charges:                        Mauro Kaufmann PT Acute Rehabilitation Services Pager [phone] Office [phone]    Darlena Koval 09/28/2020, 3:23 PM

## 2020-09-28 NOTE — Progress Notes (Signed)
Physical Therapy Treatment Patient Details Name: Erin Fischer MRN: [id number] DOB: [birth year] Today's Date: 09/28/2020    History of Present Illness Patient is 65 y.o. female s/p Lt TKA on 09/27/20 with PMH significant for OA, GERD, hypothyroidism, anxiety, back surgery.    PT Comments    Pt very motivated and progressing with mobility but continues limited by ongoing nausea/fatigue.     Follow Up Recommendations  Home health PT;Follow surgeon's recommendation for DC plan and follow-up therapies     Equipment Recommendations  None recommended by PT    Recommendations for Other Services       Precautions / Restrictions Precautions Precautions: Fall Restrictions Weight Bearing Restrictions: No LLE Weight Bearing: Weight bearing as tolerated    Mobility  Bed Mobility Overal bed mobility: Needs Assistance Bed Mobility: Supine to Sit     Supine to sit: Supervision     General bed mobility comments: Increased time but no physical assist    Transfers Overall transfer level: Needs assistance Equipment used: Rolling walker (2 wheeled) Transfers: Sit to/from Stand Sit to Stand: Min assist         General transfer comment: cues for LE management and use of UEs to self assist.  Ambulation/Gait Ambulation/Gait assistance: Min assist;Min guard Gait Distance (Feet): 28 Feet Assistive device: Rolling walker (2 wheeled) Gait Pattern/deviations: Step-to pattern;Decreased step length - right;Decreased step length - left;Shuffle;Trunk flexed Gait velocity: decr   General Gait Details: cues for sequence, posture, position from RW; distance ltd by fatigue/nausea/pain   Stairs             Wheelchair Mobility    Modified Rankin (Stroke Patients Only)       Balance Overall balance assessment: Needs assistance Sitting-balance support: Feet supported Sitting balance-Leahy Scale: Good     Standing balance support: During functional activity;Bilateral upper  extremity supported Standing balance-Leahy Scale: Poor                              Cognition Arousal/Alertness: Awake/alert Behavior During Therapy: WFL for tasks assessed/performed Overall Cognitive Status: Within Functional Limits for tasks assessed                                        Exercises Total Joint Exercises Ankle Circles/Pumps: AROM;Both;20 reps;Seated Quad Sets: AROM;Both;10 reps;Supine Heel Slides: Left;AAROM;10 reps;Supine Straight Leg Raises: AAROM;AROM;Left;Supine;Other reps (comment) (12 reps) Goniometric ROM: AAROM L knee -5 - 40    General Comments        Pertinent Vitals/Pain Pain Assessment: 0-10 Pain Score: 8  Pain Location: Lt knee and thigh Pain Descriptors / Indicators: Aching;Discomfort Pain Intervention(s): Limited activity within patient's tolerance;Monitored during session;Premedicated before session;Ice applied    Home Living                      Prior Function            PT Goals (current goals can now be found in the care plan section) Acute Rehab PT Goals Patient Stated Goal: stop hurting and regain independence and walk without pain PT Goal Formulation: With patient Time For Goal Achievement: 10/04/20 Potential to Achieve Goals: Good Progress towards PT goals: Progressing toward goals    Frequency    7X/week      PT Plan Current plan remains appropriate    Co-evaluation  AM-PAC PT "6 Clicks" Mobility   Outcome Measure  Help needed turning from your back to your side while in a flat bed without using bedrails?: A Little Help needed moving from lying on your back to sitting on the side of a flat bed without using bedrails?: A Little Help needed moving to and from a bed to a chair (including a wheelchair)?: A Little Help needed standing up from a chair using your arms (e.g., wheelchair or bedside chair)?: A Little Help needed to walk in hospital room?: A  Little Help needed climbing 3-5 steps with a railing? : A Lot 6 Click Score: 17    End of Session Equipment Utilized During Treatment: Gait belt Activity Tolerance: Patient limited by fatigue;Patient limited by pain;Other (comment) (nausea) Patient left: in chair;with call bell/phone within reach;with chair alarm set;with family/visitor present Nurse Communication: Mobility status PT Visit Diagnosis: Muscle weakness (generalized) (M62.81);Difficulty in walking, not elsewhere classified (R26.2);Pain Pain - Right/Left: Left Pain - part of body: Knee     Time: 0817-0852 PT Time Calculation (min) (ACUTE ONLY): 35 min  Charges:  $Gait Training: 8-22 mins $Therapeutic Exercise: 8-22 mins                     Mauro Kaufmann PT Acute Rehabilitation Services Pager [phone] Office [phone]    Erin Fischer 09/28/2020, 9:08 AM

## 2020-09-28 NOTE — Progress Notes (Addendum)
Patient ID: Erin Fischer, female   DOB: [birth year], 65 y.o.   MRN: [id number]  Subjective: 1 Day Post-Op Procedure(s) (LRB): TOTAL KNEE ARTHROPLASTY (Left) Patient reports pain as moderate.    Patient has complaints of left knee and thigh pain  We will start therapy today. Plan is to go home with HHPT after hospital stay.  Objective: Vital signs in last 24 hours: Temp:  [97.7 F (36.5 C)-98.4 F (36.9 C)] 98.2 F (36.8 C) (07/14 0605) Pulse Rate:  [63-70] 70 (07/14 0605) Resp:  [16-20] 16 (07/14 0605) BP: (130-151)/(62-84) 136/62 (07/14 0605) SpO2:  [97 %-100 %] 99 % (07/14 0605)  Intake/Output from previous day:  Intake/Output Summary (Last 24 hours) at 09/28/2020 0927 Last data filed at 09/28/2020 0600 Gross per 24 hour  Intake 3230.48 ml  Output 2476 ml  Net 754.48 ml    Intake/Output this shift: No intake/output data recorded.  Labs: Results for orders placed or performed during the hospital encounter of 09/27/20  CBC  Result Value Ref Range   WBC 13.9 (H) 4.0 - 10.5 K/uL   RBC 4.50 3.87 - 5.11 MIL/uL   Hemoglobin 13.1 12.0 - 15.0 g/dL   HCT 87.6 81.1 - 57.2 %   MCV 87.6 80.0 - 100.0 fL   MCH 29.1 26.0 - 34.0 pg   MCHC 33.2 30.0 - 36.0 g/dL   RDW 62.0 35.5 - 97.4 %   Platelets 180 150 - 400 K/uL   nRBC 0.0 0.0 - 0.2 %  Basic metabolic panel  Result Value Ref Range   Sodium 136 135 - 145 mmol/L   Potassium 4.1 3.5 - 5.1 mmol/L   Chloride 105 98 - 111 mmol/L   CO2 23 22 - 32 mmol/L   Glucose, Bld 152 (H) 70 - 99 mg/dL   BUN 9 8 - 23 mg/dL   Creatinine, Ser 1.63 0.44 - 1.00 mg/dL   Calcium 8.8 (L) 8.9 - 10.3 mg/dL   GFR, Estimated >84 >53 mL/min   Anion gap 8 5 - 15    Exam - Neurologically intact ABD soft Neurovascular intact Sensation intact distally Intact pulses distally Dorsiflexion/Plantar flexion intact Incision: dressing C/D/I and no drainage No cellulitis present Compartment soft Dressing - clean, dry, no drainage Motor function intact -  moving foot and toes well on exam.  No calf pain or sign of DVT  Assessment/Plan: 1 Day Post-Op Procedure(s) (LRB): TOTAL KNEE ARTHROPLASTY (Left)  Advance diet Up with therapy D/C IV fluids Past Medical History:  Diagnosis Date   Anxiety    Arthritis    Complication of anesthesia    GERD (gastroesophageal reflux disease)    History of kidney stones    Hypothyroidism    TMJ (dislocation of temporomandibular joint)    Trigeminal neuralgia of left side of face     DVT Prophylaxis - ASA Protocol Weight-Bearing as tolerated to left leg No vaccines. Will need HHPT arranged- F2F order placed D/C when ready either later today or more likely tomorrow depending upon pain and progress with PT Discussed with Dr Shelle Iron  Lockie Pares Doralee Albino 09/28/2020, 9:27 AM   Anticipated LOS equal to or greater than 2 midnights due to - Age 37 and older with one or more of the following:  - Obesity  - Expected need for hospital services (PT, OT, Nursing) required for safe  discharge  - Anticipated need for postoperative skilled nursing care or inpatient rehab  - Active co-morbidities: None OR   - Unanticipated findings  during/Post Surgery: Slow post-op progression: GI, pain control, mobility  - Patient is a high risk of re-admission due to: None

## 2020-09-29 DIAGNOSIS — M1712 Unilateral primary osteoarthritis, left knee: Secondary | ICD-10-CM | POA: Diagnosis not present

## 2020-09-29 LAB — CBC
HCT: 37.5 % (ref 36.0–46.0)
Hemoglobin: 12.6 g/dL (ref 12.0–15.0)
MCH: 29.4 pg (ref 26.0–34.0)
MCHC: 33.6 g/dL (ref 30.0–36.0)
MCV: 87.6 fL (ref 80.0–100.0)
Platelets: 147 10*3/uL — ABNORMAL LOW (ref 150–400)
RBC: 4.28 MIL/uL (ref 3.87–5.11)
RDW: 12.8 % (ref 11.5–15.5)
WBC: 9.3 10*3/uL (ref 4.0–10.5)
nRBC: 0 % (ref 0.0–0.2)

## 2020-09-29 MED ORDER — TRAMADOL HCL 50 MG PO TABS: 50.0000 mg | ORAL_TABLET | Freq: Four times a day (QID) | ORAL | 0 refills | Status: AC | PRN

## 2020-09-29 MED ORDER — KETOROLAC TROMETHAMINE 10 MG PO TABS: 10.0000 mg | ORAL_TABLET | Freq: Two times a day (BID) | ORAL | 0 refills | Status: AC

## 2020-09-29 NOTE — Progress Notes (Signed)
Subjective: 2 Days Post-Op Procedure(s) (LRB): TOTAL KNEE ARTHROPLASTY (Left) Patient reports pain as mild and moderate.   Nausea resolved. Pain better controlled today with med change yesterday Voiding without difficulty. Feeling she could be ready to go home later today.  Objective: Vital signs in last 24 hours: Temp:  [98 F (36.7 C)-98.6 F (37 C)] 98.5 F (36.9 C) (07/15 0559) Pulse Rate:  [70-84] 84 (07/15 0559) Resp:  [16-18] 16 (07/15 0559) BP: (127-146)/(68-75) 146/75 (07/15 0559) SpO2:  [95 %-100 %] 95 % (07/15 0559)  Intake/Output from previous day: 07/14 0701 - 07/15 0700 In: 240 [P.O.:240] Out: -  Intake/Output this shift: Total I/O In: 240 [P.O.:240] Out: -   Recent Labs    09/28/20 0306 09/29/20 0309  HGB 13.1 12.6   Recent Labs    09/28/20 0306 09/29/20 0309  WBC 13.9* 9.3  RBC 4.50 4.28  HCT 39.4 37.5  PLT 180 147*   Recent Labs    09/28/20 0306  NA 136  K 4.1  CL 105  CO2 23  BUN 9  CREATININE 0.76  GLUCOSE 152*  CALCIUM 8.8*   No results for input(s): LABPT, INR in the last 72 hours.  Neurologically intact ABD soft Neurovascular intact Sensation intact distally Intact pulses distally Dorsiflexion/Plantar flexion intact Incision: dressing C/D/I and no drainage No cellulitis present Compartment soft No sign of DVT   Assessment/Plan: 2 Days Post-Op Procedure(s) (LRB): TOTAL KNEE ARTHROPLASTY (Left) Advance diet Up with therapy D/C IV fluids Plan for D/C home later today with HHPT as long as she continues to progress well Discussed with Dr Tenna Delaine Doralee Albino 09/29/2020, 8:50 AM

## 2020-09-29 NOTE — Plan of Care (Signed)

## 2020-09-29 NOTE — Progress Notes (Signed)
Provided discharge education/instructions to Pt and husband, all questions and concerns addressed. Pt not in distress, discharged home with belongings.

## 2020-09-29 NOTE — Discharge Summary (Signed)
Physician Discharge Summary   Patient ID: Erin Fischer MRN: [id number] DOB/AGE: 11/22/1955 65 y.o.  Admit date: 09/27/2020 Discharge date:   Primary Diagnosis: left knee primary osteoarthritis  Admission Diagnoses:  Past Medical History:  Diagnosis Date   Anxiety    Arthritis    Complication of anesthesia    GERD (gastroesophageal reflux disease)    History of kidney stones    Hypothyroidism    TMJ (dislocation of temporomandibular joint)    Trigeminal neuralgia of left side of face    Discharge Diagnoses:   Active Problems:   Left knee DJD  Estimated body mass index is 26.78 kg/m as calculated from the following:   Height as of this encounter: 5\' 5"  (1.651 m).   Weight as of this encounter: 73 kg.  Procedure:  Procedure(s) (LRB): TOTAL KNEE ARTHROPLASTY (Left)   Consults: None  HPI: see pre-op H&P Laboratory Data: Admission on 09/27/2020  Component Date Value Ref Range Status   WBC 09/28/2020 13.9 (A) 4.0 - 10.5 K/uL Final   RBC 09/28/2020 4.50  3.87 - 5.11 MIL/uL Final   Hemoglobin 09/28/2020 13.1  12.0 - 15.0 g/dL Final   HCT 09/30/2020 39.4  36.0 - 46.0 % Final   MCV 09/28/2020 87.6  80.0 - 100.0 fL Final   MCH 09/28/2020 29.1  26.0 - 34.0 pg Final   MCHC 09/28/2020 33.2  30.0 - 36.0 g/dL Final   RDW 09/30/2020 12.3  11.5 - 15.5 % Final   Platelets 09/28/2020 180  150 - 400 K/uL Final   nRBC 09/28/2020 0.0  0.0 - 0.2 % Final   Performed at [facility], [address], [place], [place]   Sodium 09/28/2020 136  135 - 145 mmol/L Final   Potassium 09/28/2020 4.1  3.5 - 5.1 mmol/L Final   Chloride 09/28/2020 105  98 - 111 mmol/L Final   CO2 09/28/2020 23  22 - 32 mmol/L Final   Glucose, Bld 09/28/2020 152 (A) 70 - 99 mg/dL Final   Glucose reference range applies only to samples taken after fasting for at least 8 hours.   BUN 09/28/2020 9  8 - 23 mg/dL Final   Creatinine, Ser 09/28/2020 0.76  0.44 - 1.00 mg/dL Final   Calcium  09/30/2020 8.8 (A) 8.9 - 10.3 mg/dL Final   GFR, Estimated 09/28/2020 >60  >60 mL/min Final   Comment: (NOTE) Calculated using the CKD-EPI Creatinine Equation (2021)    Anion gap 09/28/2020 8  5 - 15 Final   Performed at [facility], [address] [place], [place]   WBC 09/29/2020 9.3  4.0 - 10.5 K/uL Final   RBC 09/29/2020 4.28  3.87 - 5.11 MIL/uL Final   Hemoglobin 09/29/2020 12.6  12.0 - 15.0 g/dL Final   HCT 10/01/2020 37.5  36.0 - 46.0 % Final   MCV 09/29/2020 87.6  80.0 - 100.0 fL Final   MCH 09/29/2020 29.4  26.0 - 34.0 pg Final   MCHC 09/29/2020 33.6  30.0 - 36.0 g/dL Final   RDW 10/01/2020 12.8  11.5 - 15.5 % Final   Platelets 09/29/2020 147 (A) 150 - 400 K/uL Final   nRBC 09/29/2020 0.0  0.0 - 0.2 % Final   Performed at [facility], [address] [place], [place]  Hospital Outpatient Visit on 09/25/2020  Component Date Value Ref Range Status   SARS Coronavirus 2 09/25/2020 NEGATIVE  NEGATIVE Final   Comment: (NOTE) SARS-CoV-2  target nucleic acids are NOT DETECTED.  The SARS-CoV-2 RNA is generally detectable in upper and lower respiratory specimens during the acute phase of infection. Negative results do not preclude SARS-CoV-2 infection, do not rule out co-infections with other pathogens, and should not be used as the sole basis for treatment or other patient management decisions. Negative results must be combined with clinical observations, patient history, and epidemiological information. The expected result is Negative.  Fact Sheet for Patients: HairSlick.nohttps://www.fda.gov/media/138098/download  Fact Sheet for Healthcare Providers: quierodirigir.comhttps://www.fda.gov/media/138095/download  This test is not yet approved or cleared by the [place] FDA and  has been authorized for detection and/or diagnosis of SARS-CoV-2 by FDA under an Emergency Use Authorization (EUA). This EUA will remain  in effect (meaning this test  can be used) for the duration of the COVID-19 declaration under Se                          ction 564(b)(1) of the Act, 21 U.S.C. section 360bbb-3(b)(1), unless the authorization is terminated or revoked sooner.  Performed at [facility], [address] [place], [place] [address]   Hospital Outpatient Visit on 09/14/2020  Component Date Value Ref Range Status   MRSA, PCR 09/14/2020 NEGATIVE  NEGATIVE Final   Staphylococcus aureus 09/14/2020 NEGATIVE  NEGATIVE Final   Comment: (NOTE) The Xpert SA Assay (FDA approved for NASAL specimens in patients 64 years of age and older), is one component of a comprehensive surveillance program. It is not intended to diagnose infection nor to guide or monitor treatment. Performed at [facility], [address] [place], [place] [address]    WBC 09/14/2020 5.8  4.0 - 10.5 K/uL Final   RBC 09/14/2020 5.04  3.87 - 5.11 MIL/uL Final   Hemoglobin 09/14/2020 14.7  12.0 - 15.0 g/dL Final   HCT 09/81/191406/30/2022 44.6  36.0 - 46.0 % Final   MCV 09/14/2020 88.5  80.0 - 100.0 fL Final   MCH 09/14/2020 29.2  26.0 - 34.0 pg Final   MCHC 09/14/2020 33.0  30.0 - 36.0 g/dL Final   RDW 78/29/562106/30/2022 12.6  11.5 - 15.5 % Final   Platelets 09/14/2020 197  150 - 400 K/uL Final   nRBC 09/14/2020 0.0  0.0 - 0.2 % Final   Performed at [facility], [address] [place], [place] [address]   Sodium 09/14/2020 141  135 - 145 mmol/L Final   Potassium 09/14/2020 4.6  3.5 - 5.1 mmol/L Final   Chloride 09/14/2020 106  98 - 111 mmol/L Final   CO2 09/14/2020 31  22 - 32 mmol/L Final   Glucose, Bld 09/14/2020 82  70 - 99 mg/dL Final   Glucose reference range applies only to samples taken after fasting for at least 8 hours.   BUN 09/14/2020 12  8 - 23 mg/dL Final   Creatinine, Ser 09/14/2020 0.71  0.44 - 1.00 mg/dL Final   Calcium 78/46/962906/30/2022 9.8  8.9 - 10.3 mg/dL Final   GFR, Estimated 09/14/2020 >60  >60 mL/min Final   Comment:  (NOTE) Calculated using the CKD-EPI Creatinine Equation (2021)    Anion gap 09/14/2020 4 (A) 5 - 15 Final   Performed at [facility], [address] [place], [place] [address]   Prothrombin Time 09/14/2020 13.1  11.4 - 15.2 seconds Final   INR 09/14/2020 1.0  0.8 - 1.2 Final   Comment: (NOTE) INR goal varies based on device  and disease states. Performed at [facility], [address] [place], [place] [address]    aPTT 09/14/2020 30  24 - 36 seconds Final   Performed at [facility], [address] [place], [place] [address]   Color, Urine 09/14/2020 STRAW (A) YELLOW Final   APPearance 09/14/2020 CLEAR  CLEAR Final   Specific Gravity, Urine 09/14/2020 1.005  1.005 - 1.030 Final   pH 09/14/2020 7.0  5.0 - 8.0 Final   Glucose, UA 09/14/2020 NEGATIVE  NEGATIVE mg/dL Final   Hgb urine dipstick 09/14/2020 NEGATIVE  NEGATIVE Final   Bilirubin Urine 09/14/2020 NEGATIVE  NEGATIVE Final   Ketones, ur 09/14/2020 NEGATIVE  NEGATIVE mg/dL Final   Protein, ur 76/16/0737 NEGATIVE  NEGATIVE mg/dL Final   Nitrite 10/62/6948 NEGATIVE  NEGATIVE Final   Leukocytes,Ua 09/14/2020 NEGATIVE  NEGATIVE Final   Performed at [facility], [address] [place], [place] [address]     X-Rays:DG Knee 2 Views Left  Result Date: 09/27/2020 CLINICAL DATA:  Left knee replacement EXAM: LEFT KNEE - 1-2 VIEW COMPARISON:  None. FINDINGS: Left knee demonstrates a total knee arthroplasty without evidence of hardware failure complication. No significant joint effusion. No fracture or dislocation. Alignment is anatomic. Post-surgical changes noted in the surrounding soft tissues. IMPRESSION: Interval left total knee arthroplasty. Electronically Signed   By: Elige Ko   On: 09/27/2020 12:15    EKG:No orders found for this or any previous visit.   Hospital Course: Erin Fischer is a 65 y.o. who was admitted to [facility]. They were  brought to the operating room on 09/27/2020 and underwent Procedure(s): TOTAL KNEE ARTHROPLASTY.  Patient tolerated the procedure well and was later transferred to the recovery room and then to the orthopaedic floor for postoperative care.  They were given PO and IV analgesics for pain control following their surgery.  They were given 24 hours of postoperative antibiotics of  Anti-infectives (From admission, onward)    Start     Dose/Rate Route Frequency Ordered Stop   09/27/20 1400  ceFAZolin (ANCEF) IVPB 1 g/50 mL premix        1 g 100 mL/hr over 30 Minutes Intravenous Every 6 hours 09/27/20 1240 09/27/20 2120   09/27/20 0645  ceFAZolin (ANCEF) 2 g in sodium chloride 0.9 % 100 mL IVPB        2 g 200 mL/hr over 30 Minutes Intravenous On call to O.R. 09/27/20 0350 09/27/20 0842      and started on DVT prophylaxis in the form of Aspirin, TED hose, and SCDs .   PT and OT were ordered for total joint protocol.  Discharge planning consulted to help with postop disposition and equipment needs.  Patient had a fair night on the evening of surgery.  They started to get up OOB with therapy on day one. Continued to work with therapy into day two.  By day two, the patient had progressed with therapy and meeting their goals.  Incision was healing well.  Patient was seen in rounds and was ready to go home.   Diet: Regular diet Activity:WBAT Follow-up:in 2 weeks Disposition - Home with HHPT Discharged Condition: good    Allergies as of 09/29/2020   No Known Allergies      Medication List     STOP taking these medications    omeprazole 20 MG capsule Commonly known as: PRILOSEC       TAKE these medications    acetaminophen  325 MG tablet Commonly known as: TYLENOL Take 325-650 mg by mouth every 6 (six) hours as needed for moderate pain.   ALPRAZolam 0.25 MG tablet Commonly known as: XANAX Take 0.25 mg by mouth daily as needed for anxiety.   aspirin EC 81 MG tablet Take 1 tablet (81 mg  total) by mouth 2 (two) times daily after a meal. Day after surgery   docusate sodium 100 MG capsule Commonly known as: Colace Take 1 capsule (100 mg total) by mouth 2 (two) times daily as needed for mild constipation.   fexofenadine 180 MG tablet Commonly known as: ALLEGRA Take 180 mg by mouth daily.   ketorolac 10 MG tablet Commonly known as: TORADOL Take 1 tablet (10 mg total) by mouth 2 (two) times daily with a meal.   levothyroxine 75 MCG tablet Commonly known as: SYNTHROID Take 75 mcg by mouth daily before breakfast.   methocarbamol 500 MG tablet Commonly known as: Robaxin Take 1 tablet (500 mg total) by mouth every 8 (eight) hours as needed for muscle spasms.   multivitamin with minerals tablet Take 1 tablet by mouth daily.   omeprazole 20 MG tablet Commonly known as: PRILOSEC OTC Take 20 mg by mouth daily.   polyethylene glycol 17 g packet Commonly known as: MIRALAX / GLYCOLAX Take 17 g by mouth daily.   traMADol 50 MG tablet Commonly known as: ULTRAM Take 1-2 tablets (50-100 mg total) by mouth every 6 (six) hours as needed for moderate pain.        Follow-up Information     Jene Every, MD Follow up in 1 week(s).   Specialty: Orthopedic Surgery Contact information: [address] [place] [address] [phone]         [facility] Follow up.   Specialty: Home Health Services Why: PT Contact information: [address] [place] [address] [phone]                 Signed: Andrez Grime, PA-C Orthopaedic Surgery 09/29/2020, 9:03 AM

## 2020-09-29 NOTE — Progress Notes (Signed)
Physical Therapy Treatment Patient Details Name: Erin Fischer MRN: [id number] DOB: Jul 09, 1955 Today's Date: 09/29/2020    History of Present Illness Patient is 65 y.o. female s/p Lt TKA on 09/27/20 with PMH significant for OA, GERD, hypothyroidism, anxiety, back surgery.    PT Comments    Pt very motivated and progressing well with mobility with noted improvement in pain control and nausea.  Pt performed therex program and up to ambulate increased distance in halls including negotiating stairs.  Pt eager for dc home this date.   Follow Up Recommendations  Home health PT;Follow surgeon's recommendation for DC plan and follow-up therapies     Equipment Recommendations  None recommended by PT    Recommendations for Other Services       Precautions / Restrictions Precautions Precautions: Fall;Knee Restrictions Weight Bearing Restrictions: No LLE Weight Bearing: Weight bearing as tolerated    Mobility  Bed Mobility Overal bed mobility: Needs Assistance Bed Mobility: Supine to Sit;Sit to Supine     Supine to sit: Supervision     General bed mobility comments: Increased time but no physical assist    Transfers Overall transfer level: Needs assistance Equipment used: Rolling walker (2 wheeled) Transfers: Sit to/from Stand Sit to Stand: Supervision            Ambulation/Gait Ambulation/Gait assistance: Min guard;Supervision Gait Distance (Feet): 150 Feet Assistive device: Rolling walker (2 wheeled) Gait Pattern/deviations: Step-to pattern;Decreased step length - right;Decreased step length - left;Shuffle;Trunk flexed Gait velocity: decr   General Gait Details: min cues for sequence, posture, position from RW   Stairs Stairs: Yes Stairs assistance: Min assist Stair Management: No rails;Step to pattern;Backwards;With walker Number of Stairs: 2 General stair comments: single step 2x bkwd with cues for sequence and foot/RW placement   Wheelchair Mobility     Modified Rankin (Stroke Patients Only)       Balance Overall balance assessment: Needs assistance Sitting-balance support: Feet supported Sitting balance-Leahy Scale: Good     Standing balance support: No upper extremity supported Standing balance-Leahy Scale: Fair                              Cognition Arousal/Alertness: Awake/alert Behavior During Therapy: WFL for tasks assessed/performed Overall Cognitive Status: Within Functional Limits for tasks assessed                                        Exercises Total Joint Exercises Ankle Circles/Pumps: AROM;Both;20 reps;Seated Quad Sets: AROM;Both;Supine;15 reps Heel Slides: Left;AAROM;Supine;15 reps Straight Leg Raises: AAROM;AROM;Left;Supine;Other reps (comment)    General Comments        Pertinent Vitals/Pain Pain Assessment: 0-10 Pain Score: 5  Pain Location: Lt knee and thigh Pain Descriptors / Indicators: Aching;Discomfort Pain Intervention(s): Limited activity within patient's tolerance;Monitored during session;Premedicated before session;Ice applied    Home Living                      Prior Function            PT Goals (current goals can now be found in the care plan section) Acute Rehab PT Goals Patient Stated Goal: Regain IND PT Goal Formulation: With patient Time For Goal Achievement: 10/04/20 Potential to Achieve Goals: Good Progress towards PT goals: Progressing toward goals    Frequency    7X/week  PT Plan Current plan remains appropriate    Co-evaluation              AM-PAC PT "6 Clicks" Mobility   Outcome Measure  Help needed turning from your back to your side while in a flat bed without using bedrails?: A Little Help needed moving from lying on your back to sitting on the side of a flat bed without using bedrails?: A Little Help needed moving to and from a bed to a chair (including a wheelchair)?: A Little Help needed standing up  from a chair using your arms (e.g., wheelchair or bedside chair)?: A Little Help needed to walk in hospital room?: A Little Help needed climbing 3-5 steps with a railing? : A Little 6 Click Score: 18    End of Session Equipment Utilized During Treatment: Gait belt Activity Tolerance: Patient tolerated treatment well Patient left: in bed;with call bell/phone within reach;with bed alarm set;with family/visitor present Nurse Communication: Mobility status PT Visit Diagnosis: Muscle weakness (generalized) (M62.81);Difficulty in walking, not elsewhere classified (R26.2);Pain Pain - Right/Left: Left Pain - part of body: Knee     Time: 7062-3762 PT Time Calculation (min) (ACUTE ONLY): 32 min  Charges:  $Gait Training: 8-22 mins $Therapeutic Exercise: 8-22 mins                     Mauro Kaufmann PT Acute Rehabilitation Services Pager [phone] Office [phone]    Reizy Dunlow 09/29/2020, 12:55 PM

## 2021-05-31 ENCOUNTER — Other Ambulatory Visit: Payer: Self-pay | Admitting: Obstetrics and Gynecology

## 2021-05-31 ENCOUNTER — Other Ambulatory Visit: Payer: Self-pay | Admitting: Neurosurgery

## 2021-06-04 ENCOUNTER — Ambulatory Visit: Payer: BC Managed Care – PPO

## 2021-06-13 ENCOUNTER — Ambulatory Visit
Admission: RE | Admit: 2021-06-13 | Discharge: 2021-06-13 | Disposition: A | Discharge status: Home / Self Care | Payer: BC Managed Care – PPO | Source: Ambulatory Visit | Attending: Obstetrics and Gynecology | Admitting: Obstetrics and Gynecology

## 2021-06-13 DIAGNOSIS — Z1231 Encounter for screening mammogram for malignant neoplasm of breast: Secondary | ICD-10-CM

## 2022-05-13 ENCOUNTER — Other Ambulatory Visit: Payer: Self-pay | Admitting: Obstetrics and Gynecology

## 2022-05-13 DIAGNOSIS — Z1231 Encounter for screening mammogram for malignant neoplasm of breast: Secondary | ICD-10-CM

## 2022-06-24 ENCOUNTER — Ambulatory Visit: Payer: BC Managed Care – PPO

## 2022-07-02 IMAGING — DX DG KNEE 1-2V*L*
2 series · 2 of 2 positions shown · non-contrast
Comparison: None.

CLINICAL DATA: Left knee replacement

EXAM:
LEFT KNEE - 1-2 VIEW

[knee ap]
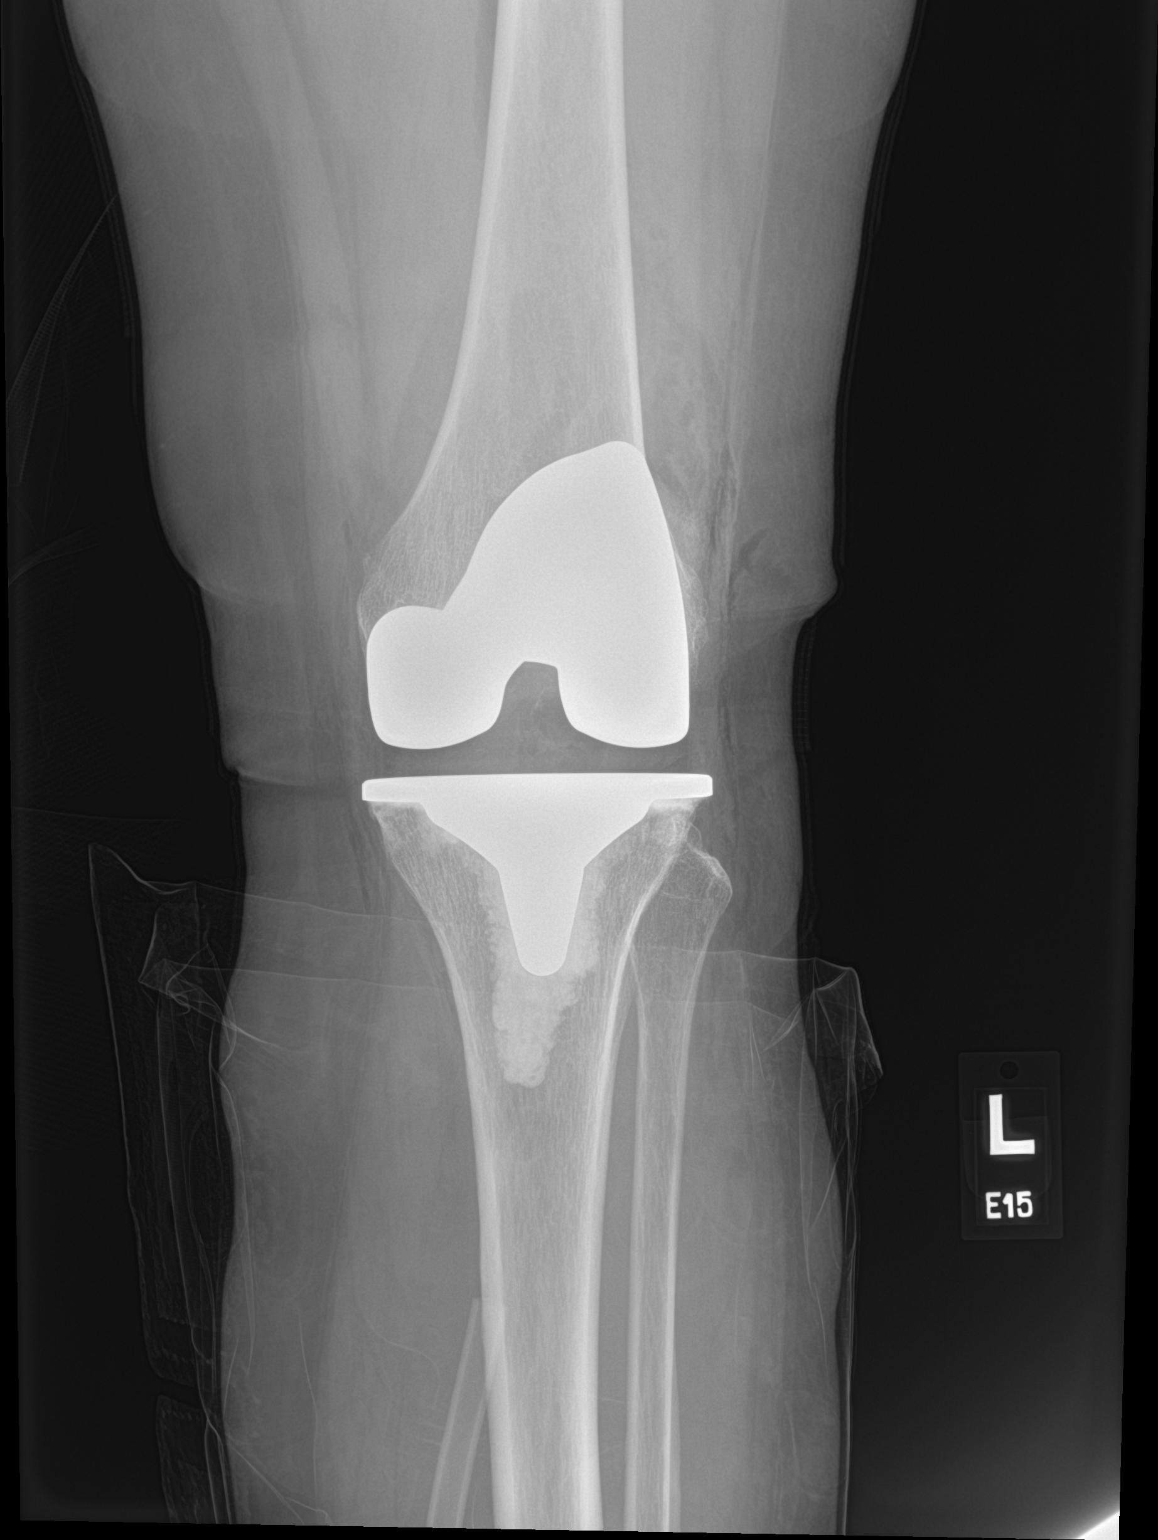

[knee lat]
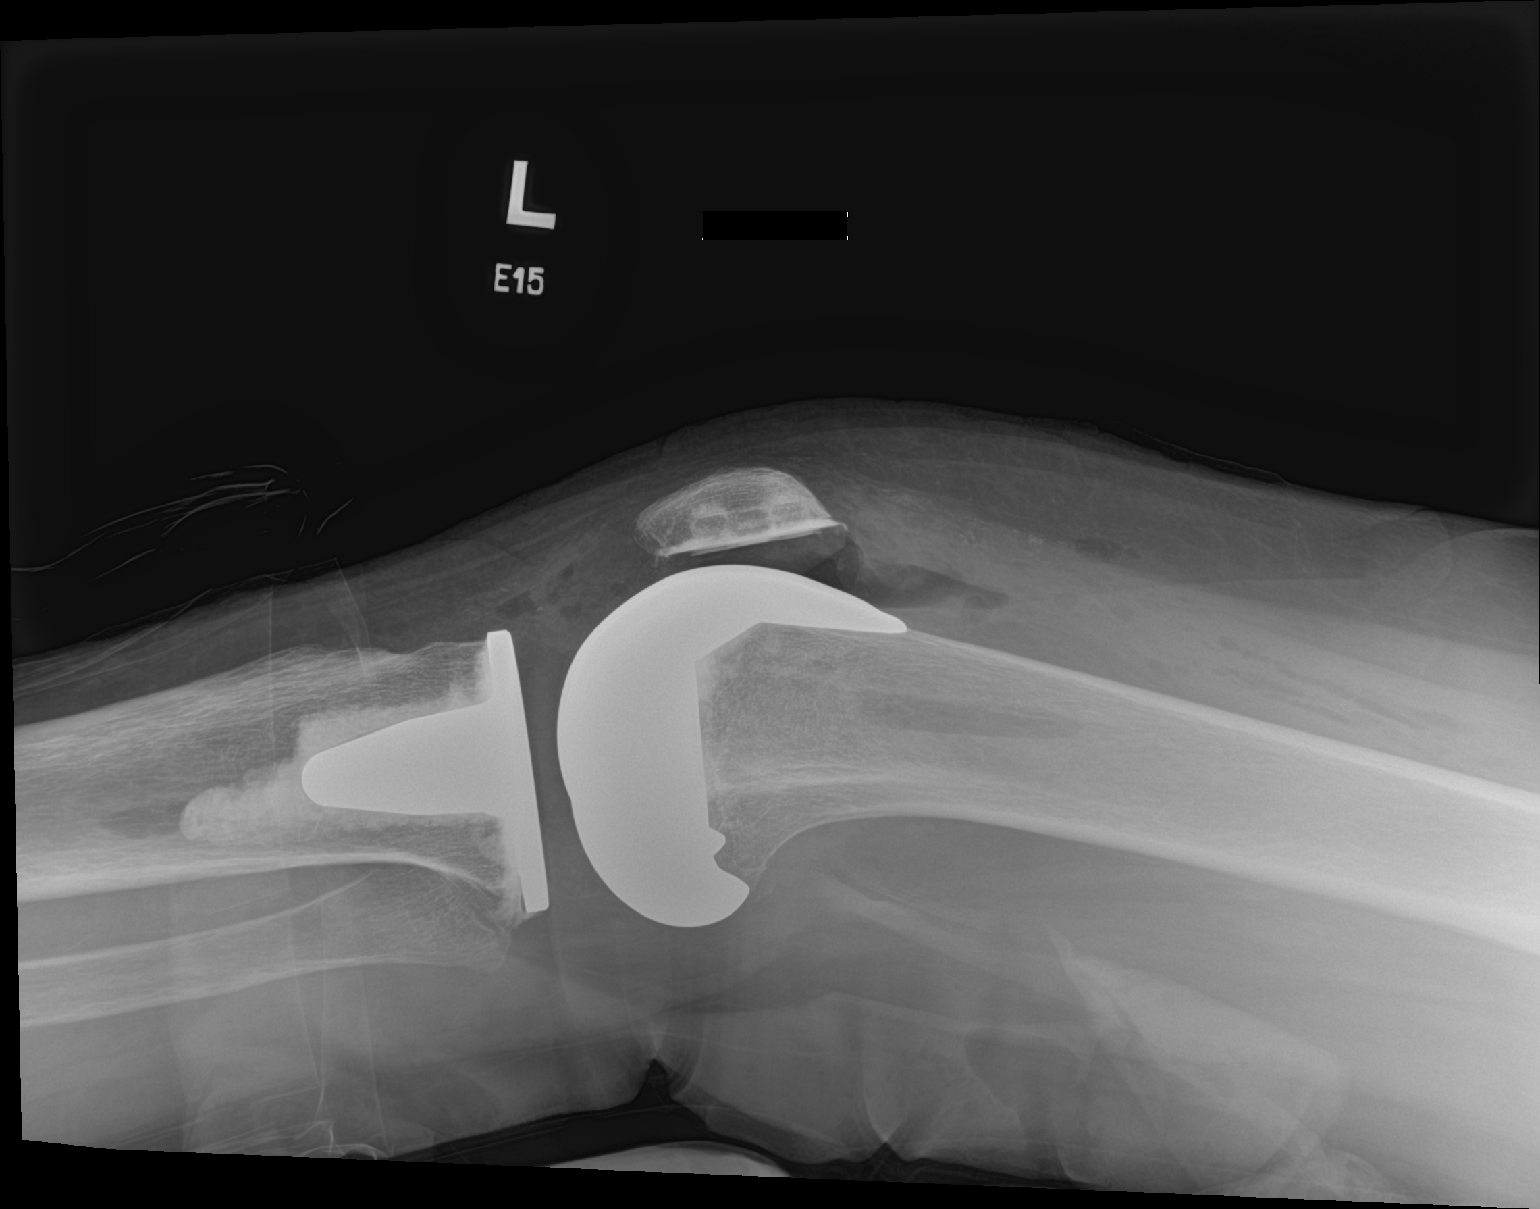

[2 of 2 positions shown; findings below may reference images not displayed]

FINDINGS: Left knee demonstrates a total knee arthroplasty without evidence of
hardware failure complication. No significant joint effusion. No
fracture or dislocation. Alignment is anatomic. Post-surgical
changes noted in the surrounding soft tissues.
IMPRESSION: Interval left total knee arthroplasty.

## 2022-07-03 ENCOUNTER — Ambulatory Visit
Admission: RE | Admit: 2022-07-03 | Discharge: 2022-07-03 | Disposition: A | Discharge status: Home / Self Care | Payer: No Typology Code available for payment source | Source: Ambulatory Visit | Attending: Obstetrics and Gynecology | Admitting: Obstetrics and Gynecology

## 2022-07-03 DIAGNOSIS — Z1231 Encounter for screening mammogram for malignant neoplasm of breast: Secondary | ICD-10-CM

## 2023-05-27 ENCOUNTER — Other Ambulatory Visit: Payer: Self-pay | Admitting: Obstetrics and Gynecology

## 2023-05-27 DIAGNOSIS — Z1231 Encounter for screening mammogram for malignant neoplasm of breast: Secondary | ICD-10-CM

## 2023-07-08 ENCOUNTER — Ambulatory Visit
Admission: RE | Admit: 2023-07-08 | Discharge: 2023-07-08 | Disposition: A | Discharge status: Home / Self Care | Source: Ambulatory Visit | Attending: Obstetrics and Gynecology | Admitting: Obstetrics and Gynecology

## 2023-07-08 DIAGNOSIS — Z1231 Encounter for screening mammogram for malignant neoplasm of breast: Secondary | ICD-10-CM

## 2023-09-12 ENCOUNTER — Other Ambulatory Visit: Payer: Self-pay

## 2023-09-12 ENCOUNTER — Emergency Department
Admission: EM | Admit: 2023-09-12 | Discharge: 2023-09-13 | Disposition: A | Discharge status: Home / Self Care | Attending: Emergency Medicine | Admitting: Emergency Medicine

## 2023-09-12 DIAGNOSIS — E039 Hypothyroidism, unspecified: Secondary | ICD-10-CM | POA: Diagnosis not present

## 2023-09-12 DIAGNOSIS — R42 Dizziness and giddiness: Secondary | ICD-10-CM | POA: Diagnosis present

## 2023-09-12 LAB — CBC
HCT: 42.9 % (ref 36.0–46.0)
Hemoglobin: 13.9 g/dL (ref 12.0–15.0)
MCH: 28 pg (ref 26.0–34.0)
MCHC: 32.4 g/dL (ref 30.0–36.0)
MCV: 86.3 fL (ref 80.0–100.0)
Platelets: 174 10*3/uL (ref 150–400)
RBC: 4.97 MIL/uL (ref 3.87–5.11)
RDW: 12.6 % (ref 11.5–15.5)
WBC: 7.6 10*3/uL (ref 4.0–10.5)
nRBC: 0 % (ref 0.0–0.2)

## 2023-09-12 LAB — URINALYSIS, ROUTINE W REFLEX MICROSCOPIC
Bilirubin Urine: NEGATIVE
Glucose, UA: NEGATIVE mg/dL
Hgb urine dipstick: NEGATIVE
Ketones, ur: NEGATIVE mg/dL
Nitrite: NEGATIVE
Protein, ur: NEGATIVE mg/dL
Specific Gravity, Urine: 1.008 (ref 1.005–1.030)
pH: 6 (ref 5.0–8.0)

## 2023-09-12 LAB — COMPREHENSIVE METABOLIC PANEL WITH GFR
ALT: 22 U/L (ref 0–44)
AST: 38 U/L (ref 15–41)
Albumin: 4.1 g/dL (ref 3.5–5.0)
Alkaline Phosphatase: 47 U/L (ref 38–126)
Anion gap: 12 (ref 5–15)
BUN: 7 mg/dL — ABNORMAL LOW (ref 8–23)
CO2: 19 mmol/L — ABNORMAL LOW (ref 22–32)
Calcium: 9 mg/dL (ref 8.9–10.3)
Chloride: 108 mmol/L (ref 98–111)
Creatinine, Ser: 0.78 mg/dL (ref 0.44–1.00)
GFR, Estimated: >60 mL/min (ref >60)
Glucose, Bld: 128 mg/dL — ABNORMAL HIGH (ref 70–99)
Potassium: 3.6 mmol/L (ref 3.5–5.1)
Sodium: 139 mmol/L (ref 135–145)
Total Bilirubin: 0.7 mg/dL (ref 0.0–1.2)
Total Protein: 7.2 g/dL (ref 6.5–8.1)

## 2023-09-12 LAB — CBG MONITORING, ED: Glucose-Capillary: 123 mg/dL — ABNORMAL HIGH (ref 70–99)

## 2023-09-12 NOTE — ED Notes (Signed)
Triage delayed due to patient in restroom.

## 2023-09-12 NOTE — ED Triage Notes (Addendum)
 Patient C/O dizziness that began about 45 minutes ago. Patient denies any cardiac history, history of DM, being outside in the heat, etc. She also denies any numbness, tingling, headache, or other neuro symptoms. Of note, patient is hypertensive in the 170's while she is normally in the 120's.

## 2023-09-12 NOTE — Discharge Instructions (Signed)
 Your workup in the Emergency Department today was reassuring.  We did not find any specific abnormalities.  We recommend you drink plenty of fluids, take your regular medications and/or any new ones prescribed today, and follow up with the doctor(s) listed in these documents as recommended.  Return to the Emergency Department if you develop new or worsening symptoms that concern you.

## 2023-09-12 NOTE — ED Provider Notes (Signed)
 [facility] Provider Note    Event Date/Time   First MD Initiated Contact with Patient 09/12/23 2259     (approximate)   History   No chief complaint on file.   HPI Erin Fischer is a 68 y.o. female whose medical history includes hypothyroidism on Synthroid  and anxiety.  She presents tonight with her husband for evaluation of not feeling quite right.  Earlier tonight, about an hour prior to arrival (approximately 6 PM), she told her husband she did not feel quite right.  She is not able to articulate any specific symptoms.  He checked her pulse and said it felt normal, but she was worried about how she felt so they decided to come in.  After they arrived, she felt a little bit nauseated but then she went to the bathroom and had a large bowel movement.  Since that time she has felt better.  She denies recent fever.  She has had no headache or visual changes.  She denies chest pain, shortness of breath, vomiting, abdominal pain, dysuria, and increased urinary frequency.  She has had no weakness in either of her extremities and is having no difficulty with word finding or speaking.  She is able to ambulate without difficulty.  Her husband noted that her blood pressure was higher than normal but she does not take any blood pressure medicine and only goes to her primary care doctor about once a year.     Physical Exam   Triage Vital Signs: ED Triage Vitals  Encounter Vitals Group     BP 09/12/23 1914 (!) 174/101     Girls Systolic BP Percentile --      Girls Diastolic BP Percentile --      Boys Systolic BP Percentile --      Boys Diastolic BP Percentile --      Pulse Rate 09/12/23 1914 85     Resp 09/12/23 1914 18     Temp 09/12/23 1914 97.7 F (36.5 C)     Temp Source 09/12/23 1914 Oral     SpO2 09/12/23 1914 100 %     Weight 09/12/23 1916 72.6 kg (160 lb)     Height 09/12/23 1916 1.651 m (5' 5)     Head Circumference --      Peak Flow --      Pain  Score --      Pain Loc --      Pain Education --      Exclude from Growth Chart --     Most recent vital signs: Vitals:   09/12/23 1914 09/12/23 2330  BP: (!) 174/101 (!) 167/88  Pulse: 85 74  Resp: 18   Temp: 97.7 F (36.5 C)   SpO2: 100% 100%    General: Awake, no distress.  Well-appearing.  Conversant, pleasant. CV:  Good peripheral perfusion.  Regular rate and rhythm, normal heart sounds. Resp:  Normal effort. Speaking easily and comfortably, no accessory muscle usage nor intercostal retractions.  Lungs are clear to auscultation. Abd:  No distention.  No tenderness to palpation throughout the abdomen. Other:  Normal grip strength bilaterally.  Good major muscle group strength in arms and legs, equal and normal.  No aphasia nor dysarthria.  Pupils are equal and reactive.   ED Results / Procedures / Treatments   Labs (all labs ordered are listed, but only abnormal results are displayed) Labs Reviewed  COMPREHENSIVE METABOLIC PANEL WITH GFR - Abnormal; Notable for the following components:  Result Value   CO2 19 (*)    Glucose, Bld 128 (*)    BUN 7 (*)    All other components within normal limits  URINALYSIS, ROUTINE W REFLEX MICROSCOPIC - Abnormal; Notable for the following components:   Color, Urine YELLOW (*)    APPearance HAZY (*)    Leukocytes,Ua SMALL (*)    Bacteria, UA RARE (*)    All other components within normal limits  CBG MONITORING, ED - Abnormal; Notable for the following components:   Glucose-Capillary 123 (*)    All other components within normal limits  CBC     EKG  ED ECG REPORT I, Darleene Dome, the attending physician, personally viewed and interpreted this ECG.  Date: 09/12/2023 EKG Time: 19: 15 Rate: 87 Rhythm: normal sinus rhythm QRS Axis: normal Intervals: normal ST/T Wave abnormalities: normal Narrative Interpretation: no evidence of acute ischemia     PROCEDURES:  Critical Care performed:  No  Procedures    IMPRESSION / MDM / ASSESSMENT AND PLAN / ED COURSE  I reviewed the triage vital signs and the nursing notes.                              Differential diagnosis includes, but is not limited to, nonspecific viral infection, UTI, volume depletion, electrolyte or metabolic abnormality, less likely ACS or CVA.  Patient's presentation is most consistent with acute presentation with potential threat to life or bodily function.  Labs/studies ordered: CBC, CMP, CBG, urinalysis, EKG  Interventions/Medications given:  Medications - No data to display  (Note:  hospital course my include additional interventions and/or labs/studies not listed above.)   Patient is hypertensive but her blood pressure came down from her initial triage blood pressure.  Urinalysis shows small leukocytes and rare bacteria but she is asymptomatic and I think this is likely asymptomatic bacteriuria rather than representing a UTI.  Blood work is reassuring, CO2 level is very slightly low but otherwise reassuring.  Patient has no anemia no leukocytosis.  Physical exam is reassuring including her neuroexam.  No indication of neurological deficits.  I provided reassurance and had my usual hypertension discussion with the patient and her husband.  I think that the high blood pressure is likely transient and situational and that she would not benefit from medication at this time.  They are comfortable with her reassuring medical screening exam and would like to follow-up as an outpatient and I think that is very reasonable.  I gave my usual and customary return precautions.       FINAL CLINICAL IMPRESSION(S) / ED DIAGNOSES   Final diagnoses:  Lightheadedness     Rx / DC Orders   ED Discharge Orders     None        Note:  This document was prepared using Dragon voice recognition software and may include unintentional dictation errors.   Dome Darleene, MD 09/12/23 [phone]
